# Patient Record
Sex: Male | Born: 1945 | Race: White | Hispanic: No | Marital: Married | Smoking: Former smoker
Health system: Southern US, Community
[De-identification: ages and names within clinical notes are randomized; demographics above are authoritative.]

## PROBLEM LIST (undated history)

## (undated) DIAGNOSIS — E785 Hyperlipidemia, unspecified: Secondary | ICD-10-CM

## (undated) DIAGNOSIS — K219 Gastro-esophageal reflux disease without esophagitis: Secondary | ICD-10-CM

## (undated) DIAGNOSIS — I4891 Unspecified atrial fibrillation: Secondary | ICD-10-CM

## (undated) DIAGNOSIS — Z860100 Personal history of colon polyps, unspecified: Secondary | ICD-10-CM

## (undated) DIAGNOSIS — J45909 Unspecified asthma, uncomplicated: Secondary | ICD-10-CM

## (undated) DIAGNOSIS — R0683 Snoring: Secondary | ICD-10-CM

## (undated) DIAGNOSIS — I4819 Other persistent atrial fibrillation: Secondary | ICD-10-CM

## (undated) DIAGNOSIS — Z8619 Personal history of other infectious and parasitic diseases: Secondary | ICD-10-CM

## (undated) DIAGNOSIS — I10 Essential (primary) hypertension: Secondary | ICD-10-CM

## (undated) HISTORY — DX: Snoring: R06.83

## (undated) HISTORY — DX: Personal history of colon polyps, unspecified: Z86.0100

## (undated) HISTORY — PX: CARDIOVERSION: SHX1299

## (undated) HISTORY — DX: Essential (primary) hypertension: I10

## (undated) HISTORY — DX: Gastro-esophageal reflux disease without esophagitis: K21.9

## (undated) HISTORY — DX: Other persistent atrial fibrillation: I48.19

## (undated) HISTORY — DX: Unspecified atrial fibrillation: I48.91

## (undated) HISTORY — DX: Unspecified asthma, uncomplicated: J45.909

## (undated) HISTORY — DX: Personal history of other infectious and parasitic diseases: Z86.19

## (undated) HISTORY — DX: Personal history of colonic polyps: Z86.010

## (undated) HISTORY — DX: Hyperlipidemia, unspecified: E78.5

---

## 2017-03-14 ENCOUNTER — Ambulatory Visit (INDEPENDENT_AMBULATORY_CARE_PROVIDER_SITE_OTHER): Payer: Medicare HMO | Admitting: Internal Medicine

## 2017-03-14 ENCOUNTER — Encounter: Payer: Self-pay | Admitting: Internal Medicine

## 2017-03-14 VITALS — BP 134/82 | HR 59 | Temp 98.0°F | Wt 251.0 lb

## 2017-03-14 DIAGNOSIS — K219 Gastro-esophageal reflux disease without esophagitis: Secondary | ICD-10-CM

## 2017-03-14 DIAGNOSIS — I480 Paroxysmal atrial fibrillation: Secondary | ICD-10-CM | POA: Diagnosis not present

## 2017-03-14 DIAGNOSIS — E7800 Pure hypercholesterolemia, unspecified: Secondary | ICD-10-CM

## 2017-03-14 NOTE — Progress Notes (Signed)
HPI  Pt presents to the clinic today to establish care and for management of the conditions listed below.   Afib: s/p cardioversion. He is taking Sotalol and ASA as prescribed. He has not established with a cardiologist in the area.  GERD: He is not sure what triggers this. He denies breakthrough on Omeprazole.  HLD: He is not sure when his cholesterol was last checked. He does not take anything to help lower his cholesterol. He could be better about consuming a low fat diet.  Flu: 11/2015 Tetanus: unsure Pneumovax: 2017 Prevnar: 2016 Zostovax: 2017 PSA Screening: unsure Colon Screening: 2016 Vision Screening: as needed Dentist: as needed  Past Medical History:  Diagnosis Date  . Atrial fibrillation ([place])   . Childhood asthma   . GERD (gastroesophageal reflux disease)   . History of chicken pox   . History of colon polyps   . Hyperlipidemia     Current Outpatient Medications  Medication Sig Dispense Refill  . aspirin 81 MG tablet Take 81 mg by mouth daily.    Marland Kitchen omeprazole (PRILOSEC) 20 MG capsule Take 20 mg by mouth daily.    . sotalol (BETAPACE) 120 MG tablet Take 120 mg by mouth 2 (two) times daily.     No current facility-administered medications for this visit.     No Known Allergies  Family History  Problem Relation Age of Onset  . Heart attack Father   . Breast cancer Sister   . Arthritis Maternal Grandmother     Social History   Socioeconomic History  . Marital status: Married    Spouse name: Not on file  . Number of children: Not on file  . Years of education: Not on file  . Highest education level: Not on file  Social Needs  . Financial resource strain: Not on file  . Food insecurity - worry: Not on file  . Food insecurity - inability: Not on file  . Transportation needs - medical: Not on file  . Transportation needs - non-medical: Not on file  Occupational History  . Not on file  Tobacco Use  . Smoking status: Never Smoker  . Smokeless  tobacco: Never Used  Substance and Sexual Activity  . Alcohol use: Yes    Comment: daily liquor  . Drug use: No  . Sexual activity: Not on file  Other Topics Concern  . Not on file  Social History Narrative  . Not on file    ROS:  Constitutional: Denies fever, malaise, fatigue, headache or abrupt weight changes.  Respiratory: Denies difficulty breathing, shortness of breath, cough or sputum production.   Cardiovascular: Denies chest pain, chest tightness, palpitations or swelling in the hands or feet.  Gastrointestinal: Denies abdominal pain, bloating, constipation, diarrhea or blood in the stool.  Neurological: Denies dizziness, difficulty with memory, difficulty with speech or problems with balance and coordination.  Psych: Denies anxiety, depression, SI/HI.  No other specific complaints in a complete review of systems (except as listed in HPI above).  PE:  BP 134/82   Pulse (!) 59   Temp 98 F (36.7 C) (Oral)   Wt 251 lb (113.9 kg)   SpO2 98%  Wt Readings from Last 3 Encounters:  03/14/17 251 lb (113.9 kg)    General: Appears his stated age, obese in NAD. Cardiovascular: Normal rate and rhythm. S1,S2 noted.  No murmur, rubs or gallops noted. No JVD or BLE edema. No carotid bruits noted. Pulmonary/Chest: Normal effort and positive vesicular breath sounds. No respiratory  distress. No wheezes, rales or ronchi noted.  Abdomen: Soft and nontender. Normal bowel sounds, no bruits noted.  Neurological: Alert and oriented.  Psychiatric: Mood and affect normal. Behavior is normal. Judgment and thought content normal.    Assessment and Plan:

## 2017-03-14 NOTE — Assessment & Plan Note (Signed)
Continue Sotalol and ASA He declines referral to cardiology at this time

## 2017-03-14 NOTE — Assessment & Plan Note (Signed)
Encouraged him to consume a low fat diet Will check lipid panel at annual exam

## 2017-03-14 NOTE — Assessment & Plan Note (Signed)
Controlled on Omeprazole He is not interested in weaning medication at this time Discussed how weight loss may help improve his reflux symptoms

## 2017-03-14 NOTE — Patient Instructions (Signed)
Fat and Cholesterol Restricted Diet Getting too much fat and cholesterol in your diet may cause health problems. Following this diet helps keep your fat and cholesterol at normal levels. This can keep you from getting sick. What types of fat should I choose?  Choose monosaturated and polyunsaturated fats. These are found in foods such as olive oil, canola oil, flaxseeds, walnuts, almonds, and seeds.  Eat more omega-3 fats. Good choices include salmon, mackerel, sardines, tuna, flaxseed oil, and ground flaxseeds.  Limit saturated fats. These are in animal products such as meats, butter, and cream. They can also be in plant products such as palm oil, palm kernel oil, and coconut oil.  Avoid foods with partially hydrogenated oils in them. These contain trans fats. Examples of foods that have trans fats are stick margarine, some tub margarines, cookies, crackers, and other baked goods. What general guidelines do I need to follow?  Check food labels. Look for the words "trans fat" and "saturated fat."  When preparing a meal: ? Fill half of your plate with vegetables and green salads. ? Fill one fourth of your plate with whole grains. Look for the word "whole" as the first word in the ingredient list. ? Fill one fourth of your plate with lean protein foods.  Eat more foods that have fiber, like apples, carrots, beans, peas, and barley.  Eat more home-cooked foods. Eat less at restaurants and buffets.  Limit or avoid alcohol.  Limit foods high in starch and sugar.  Limit fried foods.  Cook foods without frying them. Baking, boiling, grilling, and broiling are all great options.  Lose weight if you are overweight. Losing even a small amount of weight can help your overall health. It can also help prevent diseases such as diabetes and heart disease. What foods can I eat? Grains Whole grains, such as whole wheat or whole grain breads, crackers, cereals, and pasta. Unsweetened oatmeal,  bulgur, barley, quinoa, or brown rice. Corn or whole wheat flour tortillas. Vegetables Fresh or frozen vegetables (raw, steamed, roasted, or grilled). Green salads. Fruits All fresh, canned (in natural juice), or frozen fruits. Meat and Other Protein Products Ground beef (85% or leaner), grass-fed beef, or beef trimmed of fat. Skinless chicken or turkey. Ground chicken or turkey. Pork trimmed of fat. All fish and seafood. Eggs. Dried beans, peas, or lentils. Unsalted nuts or seeds. Unsalted canned or dry beans. Dairy Low-fat dairy products, such as skim or 1% milk, 2% or reduced-fat cheeses, low-fat ricotta or cottage cheese, or plain low-fat yogurt. Fats and Oils Tub margarines without trans fats. Light or reduced-fat mayonnaise and salad dressings. Avocado. Olive, canola, sesame, or safflower oils. Natural peanut or almond butter (choose ones without added sugar and oil). The items listed above may not be a complete list of recommended foods or beverages. Contact your dietitian for more options. What foods are not recommended? Grains White bread. White pasta. White rice. Cornbread. Bagels, pastries, and croissants. Crackers that contain trans fat. Vegetables White potatoes. Corn. Creamed or fried vegetables. Vegetables in a cheese sauce. Fruits Dried fruits. Canned fruit in light or heavy syrup. Fruit juice. Meat and Other Protein Products Fatty cuts of meat. Ribs, chicken wings, bacon, sausage, bologna, salami, chitterlings, fatback, hot dogs, bratwurst, and packaged luncheon meats. Liver and organ meats. Dairy Whole or 2% milk, cream, half-and-half, and cream cheese. Whole milk cheeses. Whole-fat or sweetened yogurt. Full-fat cheeses. Nondairy creamers and whipped toppings. Processed cheese, cheese spreads, or cheese curds. Sweets and Desserts Corn   syrup, sugars, honey, and molasses. Candy. Jam and jelly. Syrup. Sweetened cereals. Cookies, pies, cakes, donuts, muffins, and ice  cream. Fats and Oils Butter, stick margarine, lard, shortening, ghee, or bacon fat. Coconut, palm kernel, or palm oils. Beverages Alcohol. Sweetened drinks (such as sodas, lemonade, and fruit drinks or punches). The items listed above may not be a complete list of foods and beverages to avoid. Contact your dietitian for more information. This information is not intended to replace advice given to you by your health care provider. Make sure you discuss any questions you have with your health care provider. Document Released: 06/21/2011 Document Revised: 08/27/2015 Document Reviewed: 03/21/2013 Elsevier Interactive Patient Education  2018 Else[employer] Inc[employer]  

## 2017-07-14 ENCOUNTER — Encounter: Payer: Self-pay | Admitting: Internal Medicine

## 2017-07-14 MED ORDER — SOTALOL HCL 120 MG PO TABS: 120.0000 mg | ORAL_TABLET | Freq: Two times a day (BID) | ORAL | 0 refills | Status: DC

## 2017-08-25 DIAGNOSIS — H5203 Hypermetropia, bilateral: Secondary | ICD-10-CM | POA: Diagnosis not present

## 2017-08-25 DIAGNOSIS — H2513 Age-related nuclear cataract, bilateral: Secondary | ICD-10-CM | POA: Diagnosis not present

## 2017-08-26 DIAGNOSIS — Z0100 Encounter for examination of eyes and vision without abnormal findings: Secondary | ICD-10-CM | POA: Diagnosis not present

## 2017-10-07 ENCOUNTER — Other Ambulatory Visit: Payer: Self-pay | Admitting: Internal Medicine

## 2017-12-04 ENCOUNTER — Ambulatory Visit (INDEPENDENT_AMBULATORY_CARE_PROVIDER_SITE_OTHER): Payer: Medicare HMO | Admitting: Internal Medicine

## 2017-12-04 ENCOUNTER — Encounter: Payer: Self-pay | Admitting: Internal Medicine

## 2017-12-04 VITALS — BP 132/84 | HR 71 | Temp 97.8°F | Ht 69.0 in | Wt 256.0 lb

## 2017-12-04 DIAGNOSIS — E7800 Pure hypercholesterolemia, unspecified: Secondary | ICD-10-CM | POA: Diagnosis not present

## 2017-12-04 DIAGNOSIS — K219 Gastro-esophageal reflux disease without esophagitis: Secondary | ICD-10-CM | POA: Diagnosis not present

## 2017-12-04 DIAGNOSIS — I10 Essential (primary) hypertension: Secondary | ICD-10-CM | POA: Insufficient documentation

## 2017-12-04 DIAGNOSIS — Z0000 Encounter for general adult medical examination without abnormal findings: Secondary | ICD-10-CM | POA: Diagnosis not present

## 2017-12-04 DIAGNOSIS — Z1159 Encounter for screening for other viral diseases: Secondary | ICD-10-CM

## 2017-12-04 DIAGNOSIS — Z125 Encounter for screening for malignant neoplasm of prostate: Secondary | ICD-10-CM

## 2017-12-04 DIAGNOSIS — I480 Paroxysmal atrial fibrillation: Secondary | ICD-10-CM | POA: Diagnosis not present

## 2017-12-04 LAB — CBC
HEMATOCRIT: 47.2 % (ref 39.0–52.0)
Hemoglobin: 16.3 g/dL (ref 13.0–17.0)
MCHC: 34.6 g/dL (ref 30.0–36.0)
MCV: 89.4 fl (ref 78.0–100.0)
PLATELETS: 169 10*3/uL (ref 150.0–400.0)
RBC: 5.28 Mil/uL (ref 4.22–5.81)
RDW: 14.2 % (ref 11.5–15.5)
WBC: 7.1 10*3/uL (ref 4.0–10.5)

## 2017-12-04 LAB — COMPREHENSIVE METABOLIC PANEL
ALBUMIN: 4.1 g/dL (ref 3.5–5.2)
ALT: 14 U/L (ref 0–53)
AST: 16 U/L (ref 0–37)
Alkaline Phosphatase: 65 U/L (ref 39–117)
BUN: 10 mg/dL (ref 6–23)
CALCIUM: 8.7 mg/dL (ref 8.4–10.5)
CHLORIDE: 105 meq/L (ref 96–112)
CO2: 25 mEq/L (ref 19–32)
Creatinine, Ser: 0.93 mg/dL (ref 0.40–1.50)
GFR: 84.83 mL/min (ref >60.00)
Glucose, Bld: 110 mg/dL — ABNORMAL HIGH (ref 70–99)
POTASSIUM: 4.5 meq/L (ref 3.5–5.1)
Sodium: 138 mEq/L (ref 135–145)
Total Bilirubin: 0.7 mg/dL (ref 0.2–1.2)
Total Protein: 6.5 g/dL (ref 6.0–8.3)

## 2017-12-04 LAB — LIPID PANEL
CHOLESTEROL: 184 mg/dL (ref 0–200)
HDL: 39.3 mg/dL (ref >39.00)
LDL Cholesterol: 127 mg/dL — ABNORMAL HIGH (ref 0–99)
NonHDL: 145.03
TRIGLYCERIDES: 88 mg/dL (ref 0.0–149.0)
Total CHOL/HDL Ratio: 5
VLDL: 17.6 mg/dL (ref 0.0–40.0)

## 2017-12-04 LAB — PSA, MEDICARE: PSA: 7.38 ng/ml — ABNORMAL HIGH (ref 0.10–4.00)

## 2017-12-04 NOTE — Progress Notes (Signed)
HPI:  Pt presents to the clinic today for his Medicare Wellness Exam. He is also due to follow up chronic conditions.  Afib: Paroxysmal s/p cardioversion. Managed on Sotalol and ASA. There is no ECG on file. He does not follow with cardiology.  GERD: He is not sure what triggers this. He denies breakthrough on Omeprazole.   HLD: There is no lipid panel on file. He is not currently taking any cholesterol lowering medication. He tries to consume a low fat diet.  Elevated Blood Pressure: His last 2 blood pressures were 132/84, 134/82. He reports his systolic's run 761-607 at home. He denies headaches, dizziness or visual changes. He has never been treated for HTN in the past.   Past Medical History:  Diagnosis Date  . Atrial fibrillation ([place])   . Childhood asthma   . GERD (gastroesophageal reflux disease)   . History of chicken pox   . History of colon polyps   . Hyperlipidemia     Current Outpatient Medications  Medication Sig Dispense Refill  . aspirin 81 MG tablet Take 81 mg by mouth daily.    Marland Kitchen omeprazole (PRILOSEC) 20 MG capsule Take 20 mg by mouth daily.    . sotalol (BETAPACE) 120 MG tablet TAKE 1 TABLET (120 MG TOTAL) BY MOUTH 2 (TWO) TIMES DAILY. 180 tablet 0   No current facility-administered medications for this visit.     No Known Allergies  Family History  Problem Relation Age of Onset  . Heart attack Father   . Breast cancer Sister   . Arthritis Maternal Grandmother     Social History   Socioeconomic History  . Marital status: Married    Spouse name: Not on file  . Number of children: Not on file  . Years of education: Not on file  . Highest education level: Not on file  Occupational History  . Not on file  Social Needs  . Financial resource strain: Not on file  . Food insecurity:    Worry: Not on file    Inability: Not on file  . Transportation needs:    Medical: Not on file    Non-medical: Not on file  Tobacco Use  . Smoking status: Never  Smoker  . Smokeless tobacco: Never Used  Substance and Sexual Activity  . Alcohol use: Yes    Comment: daily liquor  . Drug use: No  . Sexual activity: Not on file  Lifestyle  . Physical activity:    Days per week: Not on file    Minutes per session: Not on file  . Stress: Not on file  Relationships  . Social connections:    Talks on phone: Not on file    Gets together: Not on file    Attends religious service: Not on file    Active member of club or organization: Not on file    Attends meetings of clubs or organizations: Not on file    Relationship status: Not on file  . Intimate partner violence:    Fear of current or ex partner: Not on file    Emotionally abused: Not on file    Physically abused: Not on file    Forced sexual activity: Not on file  Other Topics Concern  . Not on file  Social History Narrative  . Not on file    Hospitiliaztions: None  Health Maintenance:    Flu: 10/2015  Tetanus: unsure  Pneumovax: 2017  Prevnar: 2016  Zostavax: 2017  PSA: unsure  Colon  Screening: 2016  Eye Doctor: 08/2017  Dental Exam: as needed, no dentures   Providers:   PCP: Webb Silversmith, NP-C    I have personally reviewed and have noted:  1. The patient's medical and social history 2. Their use of alcohol, tobacco or illicit drugs 3. Their current medications and supplements 4. The patient's functional ability including ADL's, fall risks, home safety risks and hearing or visual impairment. 5. Diet and physical activities 6. Evidence for depression or mood disorder  Subjective:   Review of Systems:   Constitutional: Denies fever, malaise, fatigue, headache or abrupt weight changes.  HEENT: Denies eye pain, eye redness, ear pain, ringing in the ears, wax buildup, runny nose, nasal congestion, bloody nose, or sore throat. Respiratory: Denies difficulty breathing, shortness of breath, cough or sputum production.   Cardiovascular: Denies chest pain, chest tightness,  palpitations or swelling in the hands or feet.  Gastrointestinal: Denies abdominal pain, bloating, constipation, diarrhea or blood in the stool.  GU: Denies urgency, frequency, pain with urination, burning sensation, blood in urine, odor or discharge. Musculoskeletal: Denies decrease in range of motion, difficulty with gait, muscle pain or joint pain and swelling.  Skin: Denies redness, rashes, lesions or ulcercations.  Neurological: Denies dizziness, difficulty with memory, difficulty with speech or problems with balance and coordination.  Psych: Denies anxiety, depression, SI/HI.  No other specific complaints in a complete review of systems (except as listed in HPI above).  Objective:  PE:   BP 132/84   Pulse 71   Temp 97.8 F (36.6 C) (Oral)   Ht 5\' 9"  (1.753 m)   Wt 256 lb (116.1 kg)   SpO2 98%   BMI 37.80 kg/m   Wt Readings from Last 3 Encounters:  03/14/17 251 lb (113.9 kg)    General: Appears his stated age, obese, in NAD. Skin: Warm, dry and intact.  HEENT: Head: normal shape and size; Eyes: sclera white, no icterus, conjunctiva pink, PERRLA and EOMs intact; Ears: Tm's gray and intact, normal light reflex; Throat/Mouth: Teeth present, mucosa pink and moist, no exudate, lesions or ulcerations noted.  Neck: Neck supple, trachea midline. No masses, lumps or thyromegaly present.  Cardiovascular: Normal rate and rhythm. S1,S2 noted.  No murmur, rubs or gallops noted. No JVD or BLE edema. No carotid bruits noted. Pulmonary/Chest: Normal effort and positive vesicular breath sounds. No respiratory distress. No wheezes, rales or ronchi noted.  Abdomen: Soft and nontender. Normal bowel sounds. Ventral hernia noted. Liver, spleen and kidneys non palpable. Musculoskeletal:  Strength 5/5 BUE/BLE. No signs of joint swelling.  Neurological: Alert and oriented. Cranial nerves II-XII grossly intact. Coordination normal.  Psychiatric: Mood and affect normal. Behavior is normal. Judgment  and thought content normal.   Assessment and Plan:   Medicare Annual Wellness Visit:  Diet: He does eat meat. He consumes fruits and veggies daily. He does eat some fried foods. She drinks mostly water, 2 cocktails nightly. Physical activity: Silver Sneakers 3 days per week, swimming and walking Depression/mood screen: Negative Hearing: Intact to whispered voice Visual acuity: Grossly normal, performs annual eye exam  ADLs: Capable Fall risk: None Home safety: Good Cognitive evaluation: Intact to orientation, naming, recall and repetition EOL planning: No adv directives, full code/ I agree  Preventative Medicine: He declines flu and tetanus booster. Pneumovax, Prevnar and Zostovax UTD. Colon screening UTD, will request copy. Encouraged him to consume a balanced diet and exercise regimen. Advised him to see an eye doctor and dentist annually. Will check CBC,  CMET, Lipid, PSA and Hep C today.   Next appointment: 1 year, Medicare Wellness Exam   Webb Silversmith, NP

## 2017-12-04 NOTE — Assessment & Plan Note (Signed)
CMET and Lipid profile today Encouraged him to consume a low fat dieet Will monitor

## 2017-12-04 NOTE — Patient Instructions (Signed)

## 2017-12-04 NOTE — Assessment & Plan Note (Signed)
Continue Omeprazole for now Discussed how weight loss and avoiding triggers can reduce reflux CBC and CMET today

## 2017-12-04 NOTE — Assessment & Plan Note (Signed)
Discussed diagnostic criteria for HTN He reports because his is not having any issues, and he feels well, he does not want to start antihypertensive therapy at this time Discussed increased risk for heart attack and stroke Discussed DASH diet and exercise for weight loss

## 2017-12-04 NOTE — Assessment & Plan Note (Signed)
Stable on Sotalol and ASA He is not following with cardiology CBC today

## 2017-12-05 LAB — HEPATITIS C ANTIBODY
HEP C AB: NONREACTIVE
SIGNAL TO CUT-OFF: 0.04 (ref <1.00)

## 2017-12-08 ENCOUNTER — Telehealth: Payer: Self-pay

## 2017-12-08 NOTE — Telephone Encounter (Signed)
Left detailed msg on VM per HIPAA  

## 2017-12-08 NOTE — Telephone Encounter (Signed)
-----   Message from Jearld Fenton, NP sent at 12/05/2017 10:28 AM EST ----- Call pt:  You do not have Hep C. Liver and kidney function normal. Glucose elevated, was he fasting? If not, this would be WNL. Cholesterol looks good. PSA elevated. Need to get notes from previous urologist (can we get this, he filled out med rec release). Would he be agreeable to urology referral at this time?

## 2017-12-08 NOTE — Telephone Encounter (Signed)
Pt returned call to melanie.

## 2018-01-10 ENCOUNTER — Other Ambulatory Visit: Payer: Self-pay | Admitting: Internal Medicine

## 2018-02-26 DIAGNOSIS — H40013 Open angle with borderline findings, low risk, bilateral: Secondary | ICD-10-CM | POA: Diagnosis not present

## 2018-02-26 DIAGNOSIS — H2513 Age-related nuclear cataract, bilateral: Secondary | ICD-10-CM | POA: Diagnosis not present

## 2018-03-02 DIAGNOSIS — H40013 Open angle with borderline findings, low risk, bilateral: Secondary | ICD-10-CM | POA: Diagnosis not present

## 2018-03-02 DIAGNOSIS — H2513 Age-related nuclear cataract, bilateral: Secondary | ICD-10-CM | POA: Diagnosis not present

## 2018-03-02 DIAGNOSIS — H2512 Age-related nuclear cataract, left eye: Secondary | ICD-10-CM | POA: Diagnosis not present

## 2018-03-08 DIAGNOSIS — H2512 Age-related nuclear cataract, left eye: Secondary | ICD-10-CM | POA: Diagnosis not present

## 2018-07-30 DIAGNOSIS — H2511 Age-related nuclear cataract, right eye: Secondary | ICD-10-CM | POA: Diagnosis not present

## 2018-07-30 DIAGNOSIS — Z961 Presence of intraocular lens: Secondary | ICD-10-CM | POA: Diagnosis not present

## 2018-08-16 DIAGNOSIS — H2511 Age-related nuclear cataract, right eye: Secondary | ICD-10-CM | POA: Diagnosis not present

## 2018-09-18 ENCOUNTER — Ambulatory Visit (INDEPENDENT_AMBULATORY_CARE_PROVIDER_SITE_OTHER): Payer: Medicare HMO

## 2018-09-18 DIAGNOSIS — Z23 Encounter for immunization: Secondary | ICD-10-CM | POA: Diagnosis not present

## 2018-10-03 ENCOUNTER — Telehealth: Payer: Self-pay | Admitting: Internal Medicine

## 2018-10-03 NOTE — Telephone Encounter (Signed)
He also wants a refill on sotalol sent to [facility]. He said he has about a week and half left. He has not contacted the pharmacy regarding prescription. Please advise on pneumonia shot. Thanks.  CB [phone]

## 2018-10-03 NOTE — Telephone Encounter (Signed)
Pt is wanting to get pneumonia shot scheduled. He said his myChart said he is overdue.

## 2018-10-03 NOTE — Telephone Encounter (Signed)
Pt also wanted to discuss getting referral for achilles heel issue. I scheduled the patient an appointment for tomorrow 10/04/18 @ 10:15am. I let patient know that you will probably go over whether he needs pneumonia shot and med refills.

## 2018-10-04 ENCOUNTER — Other Ambulatory Visit: Payer: Self-pay

## 2018-10-04 ENCOUNTER — Ambulatory Visit (INDEPENDENT_AMBULATORY_CARE_PROVIDER_SITE_OTHER): Payer: Medicare HMO | Admitting: Internal Medicine

## 2018-10-04 VITALS — BP 126/84 | HR 58 | Temp 98.6°F | Wt 256.0 lb

## 2018-10-04 DIAGNOSIS — M7661 Achilles tendinitis, right leg: Secondary | ICD-10-CM | POA: Diagnosis not present

## 2018-10-04 MED ORDER — SOTALOL HCL 120 MG PO TABS: 120.0000 mg | ORAL_TABLET | Freq: Two times a day (BID) | ORAL | 0 refills | Status: DC

## 2018-10-04 MED ORDER — MELOXICAM 7.5 MG PO TABS: 7.5000 mg | ORAL_TABLET | Freq: Every day | ORAL | 0 refills | Status: DC

## 2018-10-04 NOTE — Patient Instructions (Signed)
Achilles Tendinitis  Achilles tendinitis is inflammation of the tough, cord-like band that attaches the lower leg muscles to the heel bone (Achilles tendon). This is usually caused by overusing the tendon and the ankle joint. Achilles tendinitis usually gets better over time with treatment and caring for yourself at home. It can take weeks or months to heal completely. What are the causes? This condition may be caused by:  A sudden increase in exercise or activity, such as running.  Doing the same exercises or activities (such as jumping) over and over.  Not warming up calf muscles before exercising.  Exercising in shoes that are worn out or not made for exercise.  Having arthritis or a bone growth (spur) on the back of the heel bone. This can rub against the tendon and hurt it.  Age-related wear and tear. Tendons become less flexible with age and more likely to be injured. What are the signs or symptoms? Common symptoms of this condition include:  Pain in the Achilles tendon or in the back of the leg, just above the heel. The pain usually gets worse with exercise.  Stiffness or soreness in the back of the leg, especially in the morning.  Swelling of the skin over the Achilles tendon.  Thickening of the tendon.  Bone spurs at the bottom of the Achilles tendon, near the heel.  Trouble standing on tiptoe. How is this diagnosed? This condition is diagnosed based on your symptoms and a physical exam. You may have tests, including:  X-rays.  MRI. How is this treated? The goal of treatment is to relieve symptoms and help your injury heal. Treatment may include:  Decreasing or stopping activities that caused the tendinitis. This may mean switching to low-impact exercises like biking or swimming.  Icing the injured area.  Doing physical therapy, including strengthening and stretching exercises.  NSAIDs to help relieve pain and swelling.  Using supportive shoes, wraps, heel  lifts, or a walking boot (air cast).  Surgery. This may be done if your symptoms do not improve after 6 months.  Using high-energy shock wave impulses to stimulate the healing process (extracorporeal shock wave therapy). This is rare.  Injection of medicines to help relieve inflammation (corticosteroids). This is rare. Follow these instructions at home: If you have an air cast:  Wear the cast as told by your health care provider. Remove it only as told by your health care provider.  Loosen the cast if your toes tingle, become numb, or turn cold and blue. Activity  Gradually return to your normal activities once your health care provider approves. Do not do activities that cause pain. ? Consider doing low-impact exercises, like cycling or swimming.  If you have an air cast, ask your health care provider when it is safe for you to drive.  If physical therapy was prescribed, do exercises as told by your health care provider or physical therapist. Managing pain, stiffness, and swelling   Raise (elevate) your foot above the level of your heart while you are sitting or lying down.  Move your toes often to avoid stiffness and to lessen swelling.  If directed, put ice on the injured area: ? Put ice in a plastic bag. ? Place a towel between your skin and the bag. ? Leave the ice on for 20 minutes, 2-3 times a day General instructions  If directed, wrap your foot with an elastic bandage or other wrap. This can help keep your tendon from moving too much while it   heals. Your health care provider will show you how to wrap your foot correctly.  Wear supportive shoes or heel lifts only as told by your health care provider.  Take over-the-counter and prescription medicines only as told by your health care provider.  Keep all follow-up visits as told by your health care provider. This is important. Contact a health care provider if:  You have symptoms that gets worse.  You have pain that  does not get better with medicine.  You develop new, unexplained symptoms.  You develop warmth and swelling in your foot.  You have a fever. Get help right away if:  You have a sudden popping sound or sensation in your Achilles tendon followed by severe pain.  You cannot move your toes or foot.  You cannot put any weight on your foot. Summary  Achilles tendinitis is inflammation of the tough, cord-like band that attaches the lower leg muscles to the heel bone (Achilles tendon).  This condition is usually caused by overusing the tendon and the ankle joint. It can also be caused by arthritis or normal aging.  The most common symptoms of this condition include pain, swelling, or stiffness in the Achilles tendon or in the back of the leg.  This condition is usually treated with rest, NSAIDs, and physical therapy. This information is not intended to replace advice given to you by your health care provider. Make sure you discuss any questions you have with your health care provider. Document Released: 09/29/2004 Document Revised: 12/02/2016 Document Reviewed: 11/09/2015 Elsevier Patient Education  2020 [employer]  

## 2018-10-06 ENCOUNTER — Encounter: Payer: Self-pay | Admitting: Internal Medicine

## 2018-10-06 NOTE — Progress Notes (Signed)
Subjective:    Patient ID: Kent James, male    DOB: 1945-04-18, 73 y.o.   MRN: [id number]  HPI  Past to the clinic today complaining of right posterior heel pain.  He reports this started 2 years ago.  It is intermittent.  He denies pain as sore and achy but can be intermittently sharp.  Pain does not radiate area.  The pain worse after walking standing for long periods of time.  He has not noticed any redness or swelling has noted at the area is tender to touch.  He has been managing this with special or shoes and arch supports which have proctitis relief until this point.  He has not tried any medications on for this.  He would like a referral to podiatry.  Review of Systems      Past Medical History:  Diagnosis Date  . Atrial fibrillation ([place])   . Childhood asthma   . GERD (gastroesophageal reflux disease)   . History of chicken pox   . History of colon polyps   . Hyperlipidemia     Current Outpatient Medications  Medication Sig Dispense Refill  . aspirin 81 MG tablet Take 81 mg by mouth daily.    Marland Kitchen omeprazole (PRILOSEC) 20 MG capsule Take 20 mg by mouth daily.    . sotalol (BETAPACE) 120 MG tablet Take 1 tablet (120 mg total) by mouth 2 (two) times daily. 180 tablet 0  . meloxicam (MOBIC) 7.5 MG tablet Take 1 tablet (7.5 mg total) by mouth daily. 30 tablet 0   No current facility-administered medications for this visit.     No Known Allergies  Family History  Problem Relation Age of Onset  . Heart attack Father   . Breast cancer Sister   . Arthritis Maternal Grandmother     Social History   Socioeconomic History  . Marital status: Married    Spouse name: Not on file  . Number of children: Not on file  . Years of education: Not on file  . Highest education level: Not on file  Occupational History  . Not on file  Social Needs  . Financial resource strain: Not on file  . Food insecurity    Worry: Not on file    Inability: Not on file  . Transportation  needs    Medical: Not on file    Non-medical: Not on file  Tobacco Use  . Smoking status: Never Smoker  . Smokeless tobacco: Never Used  Substance and Sexual Activity  . Alcohol use: Yes    Comment: daily liquor  . Drug use: No  . Sexual activity: Not on file  Lifestyle  . Physical activity    Days per week: Not on file    Minutes per session: Not on file  . Stress: Not on file  Relationships  . Social Herbalist on phone: Not on file    Gets together: Not on file    Attends religious service: Not on file    Active member of club or organization: Not on file    Attends meetings of clubs or organizations: Not on file    Relationship status: Not on file  . Intimate partner violence    Fear of current or ex partner: Not on file    Emotionally abused: Not on file    Physically abused: Not on file    Forced sexual activity: Not on file  Other Topics Concern  . Not on file  Social History Narrative  . Not on file     Constitutional: Denies fever, malaise, fatigue, headache or abrupt weight changes.  Musculoskeletal: Patient reports right heel pain.  Denies decrease in range of motion, difficulty with gait, muscle pain or joint pain and swelling.  Skin: Denies redness, rashes, lesions or ulcercations.  Neurological: Denies numbness, tingling, weakness or burning sensation of the right lower extremity.    No other specific complaints in a complete review of systems (except as listed in HPI above).  Objective:   Physical Exam  BP 126/84   Pulse (!) 58   Temp 98.6 F (37 C) (Temporal)   Wt 256 lb (116.1 kg)   SpO2 98%   BMI 37.80 kg/m  Wt Readings from Last 3 Encounters:  10/04/18 256 lb (116.1 kg)  12/04/17 256 lb (116.1 kg)  03/14/17 251 lb (113.9 kg)    General: Appears his stated age, well developed, well nourished in NAD. Skin: Warm, dry and intact. No rashes, redness or swelling noted. Cardiovascular: Bradycardic.  Pedal pulses 2+ bilaterally.  Musculoskeletal: Normal flexion, extension rotation of the right ankle.  No joint swelling noted.  He points to the Achilles tendon as side of his pain.  Strength/5 BLE.  No difficulty with gait.  Neurological: Alert and oriented.  Sensation intact BLE      Component Value Date/Time   NA 138 12/04/2017 1014   K 4.5 12/04/2017 1014   CL 105 12/04/2017 1014   CO2 25 12/04/2017 1014   GLUCOSE 110 (H) 12/04/2017 1014   BUN 10 12/04/2017 1014   CREATININE 0.93 12/04/2017 1014   CALCIUM 8.7 12/04/2017 1014    Lipid Panel     Component Value Date/Time   CHOL 184 12/04/2017 1014   TRIG 88.0 12/04/2017 1014   HDL 39.30 12/04/2017 1014   CHOLHDL 5 12/04/2017 1014   VLDL 17.6 12/04/2017 1014   LDLCALC 127 (H) 12/04/2017 1014    CBC    Component Value Date/Time   WBC 7.1 12/04/2017 1014   RBC 5.28 12/04/2017 1014   HGB 16.3 12/04/2017 1014   HCT 47.2 12/04/2017 1014   PLT 169.0 12/04/2017 1014   MCV 89.4 12/04/2017 1014   MCHC 34.6 12/04/2017 1014   RDW 14.2 12/04/2017 1014    Hgb A1C No results found for: HGBA1C          Assessment & Plan:   Achilles Tendonitis:  Encouraged regular stretching Will start Meloxicam 7.5 mg daily, kidney function reviewed and normal Referral to podiatrist placed  Return precautions discussed Webb Silversmith, NP

## 2018-10-19 ENCOUNTER — Other Ambulatory Visit: Payer: Self-pay

## 2018-10-19 ENCOUNTER — Encounter: Payer: Self-pay | Admitting: Podiatry

## 2018-10-19 ENCOUNTER — Ambulatory Visit: Payer: Medicare HMO | Admitting: Podiatry

## 2018-10-19 ENCOUNTER — Ambulatory Visit (INDEPENDENT_AMBULATORY_CARE_PROVIDER_SITE_OTHER): Payer: Medicare HMO

## 2018-10-19 VITALS — BP 161/89

## 2018-10-19 DIAGNOSIS — M79672 Pain in left foot: Secondary | ICD-10-CM

## 2018-10-19 DIAGNOSIS — Q6671 Congenital pes cavus, right foot: Secondary | ICD-10-CM | POA: Diagnosis not present

## 2018-10-19 DIAGNOSIS — M7661 Achilles tendinitis, right leg: Secondary | ICD-10-CM

## 2018-10-21 ENCOUNTER — Encounter: Payer: Self-pay | Admitting: Podiatry

## 2018-10-21 NOTE — Progress Notes (Signed)
Subjective:  Patient ID: Kent James, male    DOB: 1945/09/29,  MRN: [id number]  Chief Complaint  Patient presents with  . Foot Pain    pt is here for achilles tendinitis of the right foot, pt states that the pain has been going on for about a year and a half    73 y.o. male presents with the above complaint.  Patient states that he always has his chronic Achilles tendinitis.  However today his tendinitis has gone down.  He states that because his primary care gave him Mobic.  This has extremely helped bring down the inflammation of the tendon.  It has been going on for about a year and a half.  With it being really bad about couple of months ago.  He states that it is stabbing pain in nature.  He has not done beside taking Mobic totally help alleviate the pain.  He also states that his shoes always get worn out on the outside of his feet.   Review of Systems: Negative except as noted in the HPI. Denies N/V/F/Ch.  Past Medical History:  Diagnosis Date  . Atrial fibrillation ([place])   . Childhood asthma   . GERD (gastroesophageal reflux disease)   . History of chicken pox   . History of colon polyps   . Hyperlipidemia     Current Outpatient Medications:  .  aspirin 81 MG tablet, Take 81 mg by mouth daily., Disp: , Rfl:  .  meloxicam (MOBIC) 7.5 MG tablet, Take 1 tablet (7.5 mg total) by mouth daily., Disp: 30 tablet, Rfl: 0 .  omeprazole (PRILOSEC) 20 MG capsule, Take 20 mg by mouth daily., Disp: , Rfl:  .  sotalol (BETAPACE) 120 MG tablet, Take 1 tablet (120 mg total) by mouth 2 (two) times daily., Disp: 180 tablet, Rfl: 0  Social History   Tobacco Use  Smoking Status Never Smoker  Smokeless Tobacco Never Used    No Known Allergies Objective:   Vitals:   10/19/18 1016  BP: (!) 161/89   There is no height or weight on file to calculate BMI. Constitutional Well developed. Well nourished.  Vascular Dorsalis pedis pulses palpable bilaterally. Posterior tibial pulses  palpable bilaterally. Capillary refill normal to all digits.  No cyanosis or clubbing noted. Pedal hair growth normal.  Neurologic Normal speech. Oriented to person, place, and time. Epicritic sensation to light touch grossly present bilaterally.  Dermatologic Nails well groomed and normal in appearance. No open wounds. No skin lesions.  Orthopedic:  Mild pain on palpation to the right Achilles insertion.  Positive Silfverskiold test for gastroc equinus.  No pain on the anterior aspect of the ankle joint no pain at the ATFL no pain at the medial portion of the ankle joint.  Gait examination showed calcaneal varus deformity in resting position.   Radiographs: 2 views of skeletally mature right foot.  There is no Haglund's deformity thus noted.  There is slight increase in calcaneal inclination angle.  No other bony deformities noted.  No ossicles or enthesophyte identified. Assessment:   1. Achilles tendinitis, right leg   2. Cavus deformity of right foot    Plan:  Patient was evaluated and treated and all questions answered.  Right Achilles tendinitis -Patient has tried all conservative therapy.  He states that Mobic has considerably helped him.  I instructed and educated him on stretching exercises and importance of doing them. -Given his slightly cavus foot type, I recommend the patient would benefit from custom-made  orthotics.  He states that he has not tried them but would like to purchase them. -I have Liliane Channel come over to scan for orthotics.   Return in about 4 weeks (around 11/16/2018).

## 2018-10-27 ENCOUNTER — Other Ambulatory Visit: Payer: Self-pay | Admitting: Internal Medicine

## 2018-10-31 ENCOUNTER — Other Ambulatory Visit: Payer: Self-pay

## 2018-10-31 ENCOUNTER — Emergency Department (HOSPITAL_COMMUNITY)
Admission: EM | Admit: 2018-10-31 | Discharge: 2018-10-31 | Discharge status: Against Medical Advice | Payer: Medicare HMO | Attending: Emergency Medicine | Admitting: Emergency Medicine

## 2018-10-31 ENCOUNTER — Encounter (HOSPITAL_COMMUNITY): Payer: Self-pay | Admitting: Emergency Medicine

## 2018-10-31 ENCOUNTER — Emergency Department (HOSPITAL_COMMUNITY): Payer: Medicare HMO

## 2018-10-31 ENCOUNTER — Telehealth: Payer: Self-pay

## 2018-10-31 DIAGNOSIS — Z5321 Procedure and treatment not carried out due to patient leaving prior to being seen by health care provider: Secondary | ICD-10-CM | POA: Insufficient documentation

## 2018-10-31 DIAGNOSIS — I4891 Unspecified atrial fibrillation: Secondary | ICD-10-CM | POA: Diagnosis not present

## 2018-10-31 DIAGNOSIS — I10 Essential (primary) hypertension: Secondary | ICD-10-CM | POA: Diagnosis not present

## 2018-10-31 LAB — CBC
HCT: 49.9 % (ref 39.0–52.0)
Hemoglobin: 16.9 g/dL (ref 13.0–17.0)
MCH: 30 pg (ref 26.0–34.0)
MCHC: 33.9 g/dL (ref 30.0–36.0)
MCV: 88.6 fL (ref 80.0–100.0)
Platelets: 194 10*3/uL (ref 150–400)
RBC: 5.63 MIL/uL (ref 4.22–5.81)
RDW: 13.2 % (ref 11.5–15.5)
WBC: 7.1 10*3/uL (ref 4.0–10.5)
nRBC: 0 % (ref 0.0–0.2)

## 2018-10-31 LAB — BASIC METABOLIC PANEL
Anion gap: 7 (ref 5–15)
BUN: 14 mg/dL (ref 8–23)
CO2: 26 mmol/L (ref 22–32)
Calcium: 8.9 mg/dL (ref 8.9–10.3)
Chloride: 105 mmol/L (ref 98–111)
Creatinine, Ser: 1.07 mg/dL (ref 0.61–1.24)
GFR calc Af Amer: >60 mL/min (ref >60)
GFR calc non Af Amer: >60 mL/min (ref >60)
Glucose, Bld: 109 mg/dL — ABNORMAL HIGH (ref 70–99)
Potassium: 4.1 mmol/L (ref 3.5–5.1)
Sodium: 138 mmol/L (ref 135–145)

## 2018-10-31 MED ORDER — SODIUM CHLORIDE 0.9% FLUSH: 3.0000 mL | Freq: Once | INTRAVENOUS | Status: DC

## 2018-10-31 NOTE — ED Triage Notes (Signed)
Pt reports being in a.fib for 1 week, he thought it would go away but it hasn't he also had some HTN at home with readings of 170/100. Hx cardioversion's and takes Sotalol. Reports mild SOB. A/O x4 NAD.

## 2018-10-31 NOTE — Telephone Encounter (Signed)
Pt calling for referral to cardiology, Dr Martinique. Pt has not seen card since moved from [place]. While in [place] pt had 2 cardioversions done.for over one wk pt has been in afib with very irregular and pounding heart beat.pt is not sure how fast heart beat is because he has not taken his BP or P. Pt gets SOB upon exertion. No Cp or H/A but does have dizziness on and off.Pts wife took pts BP now 172/108 P 78. Pt is going to [facility] ED by car;will take approx 45 ' to get there and wants me to call [facility] to let them know pt is on the way. I spoke with [facility] in [place] triage and she voiced understanding. FYI to Avie Echevaria NP.

## 2018-10-31 NOTE — ED Notes (Signed)
Pt stated that he felt better and that he was going home. This NT encouraged the pt to stay and be seen by one of our ED Provider. Pt stated that the wait time was too long and that he would follow up with his primary care doctor.

## 2018-10-31 NOTE — Telephone Encounter (Signed)
Noted, he needs to schedule hospital follow up

## 2018-11-01 ENCOUNTER — Encounter: Payer: Self-pay | Admitting: Internal Medicine

## 2018-11-01 NOTE — Telephone Encounter (Signed)
msg sent via my chart

## 2018-11-02 ENCOUNTER — Encounter: Payer: Self-pay | Admitting: Cardiology

## 2018-11-02 ENCOUNTER — Other Ambulatory Visit: Payer: Self-pay

## 2018-11-02 ENCOUNTER — Ambulatory Visit: Payer: Medicare HMO | Admitting: Cardiology

## 2018-11-02 VITALS — BP 165/99 | HR 64 | Temp 97.1°F | Ht 70.0 in | Wt 255.0 lb

## 2018-11-02 DIAGNOSIS — I480 Paroxysmal atrial fibrillation: Secondary | ICD-10-CM | POA: Diagnosis not present

## 2018-11-02 DIAGNOSIS — I10 Essential (primary) hypertension: Secondary | ICD-10-CM

## 2018-11-02 DIAGNOSIS — R0683 Snoring: Secondary | ICD-10-CM | POA: Diagnosis not present

## 2018-11-02 MED ORDER — APIXABAN 5 MG PO TABS: 5.0000 mg | ORAL_TABLET | Freq: Two times a day (BID) | ORAL | 3 refills | Status: DC

## 2018-11-02 MED ORDER — LISINOPRIL 10 MG PO TABS: 10.0000 mg | ORAL_TABLET | Freq: Every day | ORAL | 3 refills | Status: DC

## 2018-11-02 NOTE — Progress Notes (Signed)
Cardiology Office Note:    Date:  11/02/2018   ID:  Kent James, DOB 22-Nov-1945, MRN [id number]  PCP:  Jearld Fenton, NP  Cardiologist:  No primary care provider on file.  Electrophysiologist:  None   Referring MD: Jearld Fenton, NP   Chief Complaint  Patient presents with  . Atrial Fibrillation    History of Present Illness:    Kent James is a 73 y.o. male with a hx of atrial fibrillation who is referred by Webb Silversmith, NP for an evaluation of atrial fibrillation.  He was diagnosed with atrial fibrillation in 2018.  He underwent 2 cardioversions in 2018.  First cardioversion was successful but after 2 weeks weeks went back into atrial fibrillation.  He underwent a second cardioversion at that time.  He was also started on sotalol.  He states that over the last 2 years he has had 3-4 episodes where he has been in A. fib, typically lasting about an hour.  However he had one episode that lasted 24 hours.  He states that he always feels it when he is out of rhythm, as he notes that he is short of breath and checks his pulse and notes that it is irregular.  On 10/23/2018, he noted that he was feeling short of breath.  He checked his pulse and it was fast and irregular.  He reports his BP was very elevated up to the 170s.  States that when he checks at home has typically been in the 130s to 140s.  He continued to have symptoms, prompting him to go to the ED on 10/28.  While in the waiting room at the ED, he felt that he converted back to sinus rhythm.  EKG at the ED confirmed he was in sinus, so he left without being seen.  Currently denies any symptoms.  Does report some dyspnea on exertion, but denies any exertional chest pain.  Does have a smoking history but quit smoking 20 years ago.  Smoked 1 pack/day for 25 years.  No issues with bleeding.  Does report that he has been told he snores, and his wife has noted a few episodes where he seems to stop breathing at night.     Past  Medical History:  Diagnosis Date  . Atrial fibrillation ([place])   . Childhood asthma   . GERD (gastroesophageal reflux disease)   . History of chicken pox   . History of colon polyps   . Hyperlipidemia     No past surgical history on file.  Current Medications: Current Meds  Medication Sig  . meloxicam (MOBIC) 7.5 MG tablet TAKE 1 TABLET BY MOUTH EVERY DAY  . omeprazole (PRILOSEC) 20 MG capsule Take 20 mg by mouth daily.  . sotalol (BETAPACE) 120 MG tablet Take 1 tablet (120 mg total) by mouth 2 (two) times daily.  . [DISCONTINUED] aspirin 81 MG tablet Take 81 mg by mouth daily.     Allergies:   Patient has no known allergies.   Social History   Socioeconomic History  . Marital status: Married    Spouse name: Not on file  . Number of children: Not on file  . Years of education: Not on file  . Highest education level: Not on file  Occupational History  . Not on file  Social Needs  . Financial resource strain: Not on file  . Food insecurity    Worry: Not on file    Inability: Not on file  . Transportation needs  Medical: Not on file    Non-medical: Not on file  Tobacco Use  . Smoking status: Never Smoker  . Smokeless tobacco: Never Used  Substance and Sexual Activity  . Alcohol use: Yes    Comment: daily liquor  . Drug use: No  . Sexual activity: Not on file  Lifestyle  . Physical activity    Days per week: Not on file    Minutes per session: Not on file  . Stress: Not on file  Relationships  . Social Herbalist on phone: Not on file    Gets together: Not on file    Attends religious service: Not on file    Active member of club or organization: Not on file    Attends meetings of clubs or organizations: Not on file    Relationship status: Not on file  Other Topics Concern  . Not on file  Social History Narrative  . Not on file     Family History: The patient's family history includes Arthritis in his maternal grandmother; Breast cancer in  his sister; Heart attack in his father.  ROS:   Please see the history of present illness.     All other systems reviewed and are negative.  EKGs/Labs/Other Studies Reviewed:    The following studies were reviewed today:   EKG:  EKG is  ordered today.  The ekg ordered today demonstrates normal sinus rhythm, rate 60, QTc 438, no ST/T changes  Recent Labs: 12/04/2017: ALT 14 10/31/2018: BUN 14; Creatinine, Ser 1.07; Hemoglobin 16.9; Platelets 194; Potassium 4.1; Sodium 138  Recent Lipid Panel    Component Value Date/Time   CHOL 184 12/04/2017 1014   TRIG 88.0 12/04/2017 1014   HDL 39.30 12/04/2017 1014   CHOLHDL 5 12/04/2017 1014   VLDL 17.6 12/04/2017 1014   LDLCALC 127 (H) 12/04/2017 1014    Physical Exam:    VS:  BP (!) 165/99   Pulse 64   Temp (!) 97.1 F (36.2 C)   Ht 5\' 10"  (1.778 m)   Wt 255 lb (115.7 kg)   SpO2 97%   BMI 36.59 kg/m     Wt Readings from Last 3 Encounters:  11/02/18 255 lb (115.7 kg)  10/31/18 255 lb 11.7 oz (116 kg)  10/04/18 256 lb (116.1 kg)     GEN:  Well nourished, well developed in no acute distress HEENT: Normal NECK: No JVD; No carotid bruits LYMPHATICS: No lymphadenopathy CARDIAC: RRR, no murmurs, rubs, gallops RESPIRATORY:  Clear to auscultation without rales, wheezing or rhonchi  ABDOMEN: Soft, non-tender, non-distended MUSCULOSKELETAL:  1+ LE edema SKIN: Warm and dry NEUROLOGIC:  Alert and oriented x 3 PSYCHIATRIC:  Normal affect   ASSESSMENT:    1. Paroxysmal atrial fibrillation (HCC)   2. Essential hypertension   3. Snores    PLAN:    Paroxysmal atrial fibrillation: Currently in sinus rhythm, but had recent recurrence of AF that resolved spontaneously.  Unclear what triggered episode.  Suspect he may have undiagnosed OSA.  Currently on sotalol 120 mg twice daily for rhythm control.  He is not on anticoagulation, as CHADSVASc has been 1 due to age.  However his blood pressures have been elevated, suggesting likely  undiagnosed hypertension.  Given hypertension, his CHADSVASc score is 2, warranting anticoagulation -Continue sotalol 120 mg BID.  QTc 438 on EKG today -Start Eliquis 5 mg twice daily -Check TSH -Sleep study -TTE  Hypertension: Reports BP has been in 130s to 140s at  home.  165/99 in clinic today. -Start lisinopril 10 mg daily.  Will check BMET in 1 week -Asked him to check blood pressure daily for next 2 weeks and call with results  Suspected OSA: will order sleep study  RTC in 1 month  Medication Adjustments/Labs and Tests Ordered: Current medicines are reviewed at length with the patient today.  Concerns regarding medicines are outlined above.  Orders Placed This Encounter  Procedures  . Basic metabolic panel  . TSH  . EKG 12-Lead  . ECHOCARDIOGRAM COMPLETE  . Split night study   Meds ordered this encounter  Medications  . apixaban (ELIQUIS) 5 MG TABS tablet    Sig: Take 1 tablet (5 mg total) by mouth 2 (two) times daily.    Dispense:  180 tablet    Refill:  3  . lisinopril (ZESTRIL) 10 MG tablet    Sig: Take 1 tablet (10 mg total) by mouth daily.    Dispense:  90 tablet    Refill:  3    Patient Instructions  Medication Instructions:  Stop Aspirin Start Eliquis 5 mg twice a day  Start Lisinopril 10 mg daily  *If you need a refill on your cardiac medications before your next appointment, please call your pharmacy*  Lab Work: Bmet and TSH in 1 week  Lab order enclosed   Testing/Procedures: Schedule Echo Schedule Sleep Study  Follow-Up: At [facility], you and your health needs are our priority.  As part of our continuing mission to provide you with exceptional heart care, we have created designated Provider Care Teams.  These Care Teams include your primary Cardiologist (physician) and Advanced Practice Providers (APPs -  Physician Assistants and Nurse Practitioners) who all work together to provide you with the care you need, when you need it.  Your next  appointment:  1 month   The format for your next appointment:  Office   Provider:  Dr.Australia Droll   Check blood pressure daily   Call office in 2 weeks to report readings     Signed, Donato Heinz, MD  11/02/2018 8:55 AM    [place]

## 2018-11-02 NOTE — Patient Instructions (Addendum)
Medication Instructions:  Stop Aspirin Start Eliquis 5 mg twice a day  Start Lisinopril 10 mg daily  *If you need a refill on your cardiac medications before your next appointment, please call your pharmacy*  Lab Work: Bmet and TSH in 1 week  Lab order enclosed   Testing/Procedures: Schedule Echo Schedule Sleep Study  Follow-Up: At [facility], you and your health needs are our priority.  As part of our continuing mission to provide you with exceptional heart care, we have created designated Provider Care Teams.  These Care Teams include your primary Cardiologist (physician) and Advanced Practice Providers (APPs -  Physician Assistants and Nurse Practitioners) who all work together to provide you with the care you need, when you need it.  Your next appointment:  1 month   The format for your next appointment:  Office   Provider:  Dr.Schumann   Check blood pressure daily   Call office in 2 weeks to report readings

## 2018-11-06 ENCOUNTER — Telehealth: Payer: Self-pay | Admitting: Cardiology

## 2018-11-06 NOTE — Telephone Encounter (Signed)
Spoke with patient.  He remains in AF but rate has been well-controlled, in 70s last he checked.  He reports his symptoms are improved, not feeling as short of breath.  Since he is feeling improved and his rate is well-controlled, did not feel needed to go to ED, can wait and see if he converts.  Given he has now had a couple of AF episodes recently, and is fairly symptomatic in AF despite good rate control, question whether should change from sotalol to alternative antiarrhythmic vs consider ablation.  Can we schedule in the Afib clinic for later this week?

## 2018-11-06 NOTE — Telephone Encounter (Signed)
Pt aware of recommendations and appt made at the  AFIB clinic for 11/08/18 at 8:30 am .Adonis Housekeeper

## 2018-11-06 NOTE — Telephone Encounter (Signed)
Spoke with pt and remains in afib Pt feels HR is in mid 60's at this time but is SOB did not sleep last night could not lay flat Pt is willing to go to ED if needed for DCCV . Will forward to Dr Gardiner Rhyme for review and recommendations ./cy

## 2018-11-06 NOTE — Telephone Encounter (Signed)
° ° °  Patient calling to report he is in afib since about 1am today. 148/95 HR 73

## 2018-11-08 ENCOUNTER — Other Ambulatory Visit: Payer: Self-pay

## 2018-11-08 ENCOUNTER — Ambulatory Visit (HOSPITAL_COMMUNITY)
Admission: RE | Admit: 2018-11-08 | Discharge: 2018-11-08 | Disposition: A | Discharge status: Home / Self Care | Payer: Medicare HMO | Source: Ambulatory Visit | Attending: Physician Assistant | Admitting: Physician Assistant

## 2018-11-08 VITALS — BP 162/90 | HR 69 | Ht 70.0 in | Wt 253.6 lb

## 2018-11-08 DIAGNOSIS — Z87891 Personal history of nicotine dependence: Secondary | ICD-10-CM | POA: Insufficient documentation

## 2018-11-08 DIAGNOSIS — Z6836 Body mass index (BMI) 36.0-36.9, adult: Secondary | ICD-10-CM | POA: Insufficient documentation

## 2018-11-08 DIAGNOSIS — E669 Obesity, unspecified: Secondary | ICD-10-CM | POA: Diagnosis not present

## 2018-11-08 DIAGNOSIS — Z803 Family history of malignant neoplasm of breast: Secondary | ICD-10-CM | POA: Insufficient documentation

## 2018-11-08 DIAGNOSIS — G4730 Sleep apnea, unspecified: Secondary | ICD-10-CM | POA: Diagnosis not present

## 2018-11-08 DIAGNOSIS — I10 Essential (primary) hypertension: Secondary | ICD-10-CM | POA: Diagnosis not present

## 2018-11-08 DIAGNOSIS — I480 Paroxysmal atrial fibrillation: Secondary | ICD-10-CM | POA: Insufficient documentation

## 2018-11-08 DIAGNOSIS — R9431 Abnormal electrocardiogram [ECG] [EKG]: Secondary | ICD-10-CM | POA: Insufficient documentation

## 2018-11-08 DIAGNOSIS — Z79899 Other long term (current) drug therapy: Secondary | ICD-10-CM | POA: Diagnosis not present

## 2018-11-08 DIAGNOSIS — K219 Gastro-esophageal reflux disease without esophagitis: Secondary | ICD-10-CM | POA: Insufficient documentation

## 2018-11-08 DIAGNOSIS — D6869 Other thrombophilia: Secondary | ICD-10-CM | POA: Diagnosis not present

## 2018-11-08 DIAGNOSIS — Z7901 Long term (current) use of anticoagulants: Secondary | ICD-10-CM | POA: Insufficient documentation

## 2018-11-08 DIAGNOSIS — J45909 Unspecified asthma, uncomplicated: Secondary | ICD-10-CM | POA: Diagnosis not present

## 2018-11-08 DIAGNOSIS — R0683 Snoring: Secondary | ICD-10-CM | POA: Diagnosis not present

## 2018-11-08 DIAGNOSIS — Z8249 Family history of ischemic heart disease and other diseases of the circulatory system: Secondary | ICD-10-CM | POA: Insufficient documentation

## 2018-11-08 MED ORDER — DILTIAZEM HCL 30 MG PO TABS: ORAL_TABLET | ORAL | 1 refills | Status: DC

## 2018-11-08 NOTE — Progress Notes (Signed)
Primary Care Physician: Jearld Fenton, NP Primary Cardiologist: Dr Gardiner Rhyme Primary Electrophysiologist: none Referring Physician: Dr Lorrene Reid Dejoie is a 73 y.o. male with a history of paroxysmal atrial fibrillation and HTN who presents for consultation in the [facility].  The patient was initially diagnosed with atrial fibrillation in 2018 after presenting with symptoms of shortness of breath and an irregular pulse. He underwent two DCCV in 2018 and was started on sotalol. He had done well until 10/23/18 when he again had afib symptoms with heart rates in the 170s. He presented to the ER but converted to SR before being seen. He was seen by Dr Gardiner Rhyme and started on anticoagulation. He is on Eliquis for a CHADS2VASC score of 2. Patient does admit to snoring and witnessed apnea and a sleep study has been ordered. Patient reports that 11/05/18 he woke up in the middle of the night and felt he was back out of rhythm. He does have symptoms of fatigue with exertion and palpitations but overall feels well. He denies CP, dizziness, or SOB.  Today, he denies symptoms of chest pain, shortness of breath, orthopnea, PND, lower extremity edema, dizziness, presyncope, syncope, bleeding, or neurologic sequela. The patient is tolerating medications without difficulties and is otherwise without complaint today.    Atrial Fibrillation Risk Factors:  he does have symptoms or diagnosis of sleep apnea. he does not have a history of rheumatic fever. he does not have a history of alcohol use. The patient does not have a history of early familial atrial fibrillation or other arrhythmias.  he has a BMI of Body mass index is 36.39 kg/m.Marland Kitchen Filed Weights   11/08/18 0831  Weight: 115 kg    Family History  Problem Relation Age of Onset  . Heart attack Father   . Breast cancer Sister   . Arthritis Maternal Grandmother      Atrial Fibrillation Management history:   Previous antiarrhythmic drugs: sotalol Previous cardioversions: 2018 x2 Previous ablations: none CHADS2VASC score: 2 Anticoagulation history: Eliquis (started 11/02/18)   Past Medical History:  Diagnosis Date  . Atrial fibrillation ([place])   . Childhood asthma   . GERD (gastroesophageal reflux disease)   . History of chicken pox   . History of colon polyps   . Hyperlipidemia    No past surgical history on file.  Current Outpatient Medications  Medication Sig Dispense Refill  . apixaban (ELIQUIS) 5 MG TABS tablet Take 1 tablet (5 mg total) by mouth 2 (two) times daily. 180 tablet 3  . lisinopril (ZESTRIL) 10 MG tablet Take 1 tablet (10 mg total) by mouth daily. 90 tablet 3  . omeprazole (PRILOSEC) 20 MG capsule Take 20 mg by mouth daily.    . sotalol (BETAPACE) 120 MG tablet Take 1 tablet (120 mg total) by mouth 2 (two) times daily. 180 tablet 0  . diltiazem (CARDIZEM) 30 MG tablet Take 1 Tablet Every 4 Hours As Needed For HR <100 45 tablet 1   No current facility-administered medications for this encounter.     No Known Allergies  Social History   Socioeconomic History  . Marital status: Married    Spouse name: Not on file  . Number of children: Not on file  . Years of education: Not on file  . Highest education level: Not on file  Occupational History  . Not on file  Social Needs  . Financial resource strain: Not on file  . Food insecurity  Worry: Not on file    Inability: Not on file  . Transportation needs    Medical: Not on file    Non-medical: Not on file  Tobacco Use  . Smoking status: Former Smoker    Start date: 03/14/2017  . Smokeless tobacco: Never Used  Substance and Sexual Activity  . Alcohol use: Yes    Comment: daily liquor  . Drug use: No  . Sexual activity: Not on file  Lifestyle  . Physical activity    Days per week: Not on file    Minutes per session: Not on file  . Stress: Not on file  Relationships  . Social Herbalist on  phone: Not on file    Gets together: Not on file    Attends religious service: Not on file    Active member of club or organization: Not on file    Attends meetings of clubs or organizations: Not on file    Relationship status: Not on file  . Intimate partner violence    Fear of current or ex partner: Not on file    Emotionally abused: Not on file    Physically abused: Not on file    Forced sexual activity: Not on file  Other Topics Concern  . Not on file  Social History Narrative  . Not on file     ROS- All systems are reviewed and negative except as per the HPI above.  Physical Exam: Vitals:   11/08/18 0831  BP: (!) 162/90  Pulse: 69  Weight: 115 kg  Height: 5\' 10"  (1.778 m)    GEN- The patient is well appearing obese male, alert and oriented x 3 today.   Head- normocephalic, atraumatic Eyes-  Sclera clear, conjunctiva pink Ears- hearing intact Oropharynx- clear Neck- supple  Lungs- Clear to ausculation bilaterally, normal work of breathing Heart- irregular rate and rhythm, no murmurs, rubs or gallops  GI- soft, NT, ND, + BS Extremities- no clubbing, cyanosis, or edema MS- no significant deformity or atrophy Skin- no rash or lesion Psych- euthymic mood, full affect Neuro- strength and sensation are intact  Wt Readings from Last 3 Encounters:  11/08/18 115 kg  11/02/18 115.7 kg  10/31/18 116 kg    EKG today demonstrates afib HR 69, QRS 88, QTc 454   Epic records are reviewed at length today  Assessment and Plan:  1. Paroxysmal atrial fibrillation General education about afib provided and questions answered. We discussed his stroke risk and the risks and benefits of anticoagulation. We also discussed therapeutic options including changing AAD therapy vs ablation. Patient would like to be evaluated for ablation. Continue sotalol 120 mg BID. QT stable. Continue Eliquis 5 mg BID Will start diltiazem 30 mg PRN q4hrs for heart rate >100bpm. Echocardiogram  pending Lifestyle changes as below.   This patients CHA2DS2-VASc Score and unadjusted Ischemic Stroke Rate (% per year) is equal to 2.2 % stroke rate/year from a score of 2  Above score calculated as 1 point each if present [CHF, HTN, DM, Vascular=MI/PAD/Aortic Plaque, Age if 65-74, or Male] Above score calculated as 2 points each if present [Age > 75, or Stroke/TIA/TE]   2. Obesity Body mass index is 36.39 kg/m. Lifestyle modification was discussed at length including regular exercise and weight reduction.  3. Snoring/witnessed apnea The importance of adequate treatment of sleep apnea was discussed today in order to improve our ability to maintain sinus rhythm long term. Sleep study pending.  4. HTN Newly  diagnosed. Recently started on lisinopril.  Will hold on making further changes today.   Follow up with Dr Rayann Heman to be evaluated for afib ablation.    [facility] [address] [place], [place] [address] [phone] 11/08/2018 10:14 AM

## 2018-11-08 NOTE — Patient Instructions (Signed)
Take 1 Tablet Every 4 Hours As Needed For HR <100 and top number of BP <100 

## 2018-11-09 DIAGNOSIS — R0683 Snoring: Secondary | ICD-10-CM | POA: Diagnosis not present

## 2018-11-09 DIAGNOSIS — I480 Paroxysmal atrial fibrillation: Secondary | ICD-10-CM | POA: Diagnosis not present

## 2018-11-09 LAB — TSH: TSH: 1.5 u[IU]/mL (ref 0.450–4.500)

## 2018-11-09 LAB — BASIC METABOLIC PANEL
BUN/Creatinine Ratio: 14 (ref 10–24)
BUN: 14 mg/dL (ref 8–27)
CO2: 23 mmol/L (ref 20–29)
Calcium: 9 mg/dL (ref 8.6–10.2)
Chloride: 100 mmol/L (ref 96–106)
Creatinine, Ser: 1 mg/dL (ref 0.76–1.27)
GFR calc Af Amer: 86 mL/min/{1.73_m2} (ref >59)
GFR calc non Af Amer: 74 mL/min/{1.73_m2} (ref >59)
Glucose: 99 mg/dL (ref 65–99)
Potassium: 4.2 mmol/L (ref 3.5–5.2)
Sodium: 142 mmol/L (ref 134–144)

## 2018-11-12 ENCOUNTER — Other Ambulatory Visit: Payer: Self-pay

## 2018-11-12 ENCOUNTER — Ambulatory Visit (HOSPITAL_COMMUNITY): Payer: Medicare HMO | Attending: Internal Medicine

## 2018-11-12 DIAGNOSIS — I480 Paroxysmal atrial fibrillation: Secondary | ICD-10-CM | POA: Diagnosis not present

## 2018-11-15 ENCOUNTER — Ambulatory Visit: Payer: Medicare HMO | Admitting: Orthotics

## 2018-11-15 ENCOUNTER — Other Ambulatory Visit: Payer: Self-pay

## 2018-11-15 DIAGNOSIS — M7661 Achilles tendinitis, right leg: Secondary | ICD-10-CM

## 2018-11-15 DIAGNOSIS — Q6671 Congenital pes cavus, right foot: Secondary | ICD-10-CM

## 2018-11-15 NOTE — Progress Notes (Signed)
Patient came in today to pick up custom made foot orthotics.  The goals were accomplished and the patient reported no dissatisfaction with said orthotics.  Patient was advised of breakin period and how to report any issues. 

## 2018-11-16 ENCOUNTER — Encounter: Payer: Self-pay | Admitting: Internal Medicine

## 2018-11-16 ENCOUNTER — Telehealth (INDEPENDENT_AMBULATORY_CARE_PROVIDER_SITE_OTHER): Payer: Medicare HMO | Admitting: Internal Medicine

## 2018-11-16 ENCOUNTER — Telehealth: Payer: Self-pay | Admitting: *Deleted

## 2018-11-16 ENCOUNTER — Telehealth: Payer: Self-pay

## 2018-11-16 VITALS — BP 146/97 | HR 71 | Ht 70.0 in | Wt 253.0 lb

## 2018-11-16 DIAGNOSIS — R0683 Snoring: Secondary | ICD-10-CM | POA: Diagnosis not present

## 2018-11-16 DIAGNOSIS — I4819 Other persistent atrial fibrillation: Secondary | ICD-10-CM | POA: Diagnosis not present

## 2018-11-16 DIAGNOSIS — R5383 Other fatigue: Secondary | ICD-10-CM

## 2018-11-16 DIAGNOSIS — I10 Essential (primary) hypertension: Secondary | ICD-10-CM

## 2018-11-16 NOTE — Telephone Encounter (Signed)
-----   Message from Damian Leavell, RN sent at 11/16/2018 12:38 PM EST ----- Regarding: sleep study Pt needs sleep study per [name] for snoring/fatigue.  Sonia Baller

## 2018-11-16 NOTE — Telephone Encounter (Signed)
-----   Message from Thompson Grayer, MD sent at 11/16/2018 12:22 PM EST ----- Needs a sleep study   Afib ablation C/I/A  Cardiac CT

## 2018-11-16 NOTE — H&P (View-Only) (Signed)
Electrophysiology TeleHealth Note   Due to national recommendations of social distancing due to [place] 19, Audio/video telehealth visit is felt to be most appropriate for this patient at this time.  See MyChart message from today for patient consent regarding telehealth for Kent James.   Date:  11/16/2018   ID:  Kent James, DOB [birth year], MRN [id number]  Location: home Provider location: [address], [place] Evaluation Performed: New patient consult  PCP:  Kent Fenton, NP  Cardiologist:  Dr Gardiner Rhyme Electrophysiologist:  None   Chief Complaint:  afib  History of Present Illness:    Kent James is a 73 y.o. male who presents via audio/video conferencing for a telehealth visit today.   The patient is referred for new consultation regarding afib by Dr Gardiner Rhyme and the AF clinic.  he was initially diagnosed with afib 2018 after presenting with palpitations and SOB.  He required cardioversion at that time and was placed on sotalol.  He did well until 10/2018 when he developed recurrent afib with RVR.  He spontaneously converted to sinus rhythm. He is on eliquis for stroke prevention.  He continues to have occasional afib.  He has been in AF since 11/06/2018.  He reports significant SOB and reduced exercise tolerance since that time.  Today, he denies symptoms of palpitations, chest pain, shortness of breath, orthopnea, PND, lower extremity edema, claudication, dizziness, presyncope, syncope, bleeding, or neurologic sequela. The patient is tolerating medications without difficulties and is otherwise without complaint today.   he denies symptoms of cough, fevers, chills, or new SOB worrisome for COVID 19.   Past Medical History:  Diagnosis Date  . Childhood asthma   . Essential hypertension   . GERD (gastroesophageal reflux disease)   . History of chicken pox   . History of colon polyps   . Hyperlipidemia   . Persistent atrial fibrillation ([place])   .  Snoring     Past Surgical History:  Procedure Laterality Date  . CARDIOVERSION      Current Outpatient Medications  Medication Sig Dispense Refill  . apixaban (ELIQUIS) 5 MG TABS tablet Take 1 tablet (5 mg total) by mouth 2 (two) times daily. 180 tablet 3  . diltiazem (CARDIZEM) 30 MG tablet Take 1 Tablet Every 4 Hours As Needed For HR <100 45 tablet 1  . lisinopril (ZESTRIL) 10 MG tablet Take 1 tablet (10 mg total) by mouth daily. 90 tablet 3  . omeprazole (PRILOSEC) 20 MG capsule Take 20 mg by mouth daily.    . sotalol (BETAPACE) 120 MG tablet Take 1 tablet (120 mg total) by mouth 2 (two) times daily. 180 tablet 0   No current facility-administered medications for this visit.     Allergies:   Patient has no known allergies.   Social History:  The patient  reports that he has quit smoking. He started smoking about 20 months ago. He has never used smokeless tobacco. He reports current alcohol use. He reports that he does not use drugs.   Family History:  The patient's family history includes Arthritis in his maternal grandmother; Breast cancer in his sister; Heart attack in his father.    ROS:  Please see the history of present illness.   All other systems are personally reviewed and negative.    Exam:    Vital Signs:  BP (!) 146/97   Pulse 71   Ht 5\' 10"  (1.778 m)   Wt 253 lb (114.8 kg)   BMI  36.30 kg/m    Well sounding, alert and conversant   Labs/Other Tests and Data Reviewed:    Recent Labs: 12/04/2017: ALT 14 10/31/2018: Hemoglobin 16.9; Platelets 194 11/09/2018: BUN 14; Creatinine, Ser 1.00; Potassium 4.2; Sodium 142; TSH 1.500   Wt Readings from Last 3 Encounters:  11/16/18 253 lb (114.8 kg)  11/08/18 253 lb 9.6 oz (115 kg)  11/02/18 255 lb (115.7 kg)     Other studies personally reviewed: Additional studies/ records that were reviewed today include: AF clinic notes,  Echo 11/12/2018  Review of the above records today demonstrates: EF 65%, LA 4.1,  No valve  disease, moderate LVH  ASSESSMENT & PLAN:    1.  afib The patient has symptomatic, recurrent persistent atrial fibrillation. he has failed medical therapy with sotalol. Chads2vasc score is 2.  he is anticoagulated with eliquis . Therapeutic strategies for afib including medicine and ablation were discussed in detail with the patient today. Risk, benefits, and alternatives to EP study and radiofrequency ablation for afib were also discussed in detail today. These risks include but are not limited to stroke, bleeding, vascular damage, tamponade, perforation, damage to the esophagus, lungs, and other structures, pulmonary vein stenosis, worsening renal function, and death. The patient understands these risk and wishes to proceed.  We will therefore proceed with catheter ablation at the next available time.  Carto, ICE, anesthesia are requested for the procedure.  Will also obtain cardiac CT prior to the procedure to exclude LAA thrombus and further evaluate atrial anatomy.  2. HTN He has moderate LVH Appropriate control advised  3. Snoring and fatigue Sleep study ordered today  4. Obesity Lifestyle modification is advise  Current medicines are reviewed at length with the patient today.   The patient does not have concerns regarding his medicines.  The following changes were made today:  none  Labs/ tests ordered today include:  No orders of the defined types were placed in this encounter.   Patient Risk:  after full review of this patients clinical status, I feel that they are at high risk at this time.   Today, I have spent 20 minutes with the patient with telehealth technology discussing afib .    Signed, Kent Grayer MD, [facility] 11/16/2018 12:19 PM   [place] [address] [phone] (office) [phone] (fax)

## 2018-11-16 NOTE — Progress Notes (Signed)
Electrophysiology TeleHealth Note   Due to national recommendations of social distancing due to [place] 19, Audio/video telehealth visit is felt to be most appropriate for this patient at this time.  See MyChart message from today for patient consent regarding telehealth for Kent James.   Date:  11/16/2018   ID:  Kent James, DOB 30-Nov-1945, MRN [id number]  Location: home Provider location: [address], [place] Evaluation Performed: New patient consult  PCP:  Jearld Fenton, NP  Cardiologist:  Dr Gardiner Rhyme Electrophysiologist:  None   Chief Complaint:  afib  History of Present Illness:    Kent James is a 73 y.o. male who presents via audio/video conferencing for a telehealth visit today.   The patient is referred for new consultation regarding afib by Dr Gardiner Rhyme and the AF clinic.  he was initially diagnosed with afib 2018 after presenting with palpitations and SOB.  He required cardioversion at that time and was placed on sotalol.  He did well until 10/2018 when he developed recurrent afib with RVR.  He spontaneously converted to sinus rhythm. He is on eliquis for stroke prevention.  He continues to have occasional afib.  He has been in AF since 11/06/2018.  He reports significant SOB and reduced exercise tolerance since that time.  Today, he denies symptoms of palpitations, chest pain, shortness of breath, orthopnea, PND, lower extremity edema, claudication, dizziness, presyncope, syncope, bleeding, or neurologic sequela. The patient is tolerating medications without difficulties and is otherwise without complaint today.   he denies symptoms of cough, fevers, chills, or new SOB worrisome for COVID 19.   Past Medical History:  Diagnosis Date  . Childhood asthma   . Essential hypertension   . GERD (gastroesophageal reflux disease)   . History of chicken pox   . History of colon polyps   . Hyperlipidemia   . Persistent atrial fibrillation ([place])   .  Snoring     Past Surgical History:  Procedure Laterality Date  . CARDIOVERSION      Current Outpatient Medications  Medication Sig Dispense Refill  . apixaban (ELIQUIS) 5 MG TABS tablet Take 1 tablet (5 mg total) by mouth 2 (two) times daily. 180 tablet 3  . diltiazem (CARDIZEM) 30 MG tablet Take 1 Tablet Every 4 Hours As Needed For HR <100 45 tablet 1  . lisinopril (ZESTRIL) 10 MG tablet Take 1 tablet (10 mg total) by mouth daily. 90 tablet 3  . omeprazole (PRILOSEC) 20 MG capsule Take 20 mg by mouth daily.    . sotalol (BETAPACE) 120 MG tablet Take 1 tablet (120 mg total) by mouth 2 (two) times daily. 180 tablet 0   No current facility-administered medications for this visit.     Allergies:   Patient has no known allergies.   Social History:  The patient  reports that he has quit smoking. He started smoking about 20 months ago. He has never used smokeless tobacco. He reports current alcohol use. He reports that he does not use drugs.   Family History:  The patient's family history includes Arthritis in his maternal grandmother; Breast cancer in his sister; Heart attack in his father.    ROS:  Please see the history of present illness.   All other systems are personally reviewed and negative.    Exam:    Vital Signs:  BP (!) 146/97   Pulse 71   Ht 5\' 10"  (1.778 m)   Wt 253 lb (114.8 kg)   BMI  36.30 kg/m    Well sounding, alert and conversant   Labs/Other Tests and Data Reviewed:    Recent Labs: 12/04/2017: ALT 14 10/31/2018: Hemoglobin 16.9; Platelets 194 11/09/2018: BUN 14; Creatinine, Ser 1.00; Potassium 4.2; Sodium 142; TSH 1.500   Wt Readings from Last 3 Encounters:  11/16/18 253 lb (114.8 kg)  11/08/18 253 lb 9.6 oz (115 kg)  11/02/18 255 lb (115.7 kg)     Other studies personally reviewed: Additional studies/ records that were reviewed today include: AF clinic notes,  Echo 11/12/2018  Review of the above records today demonstrates: EF 65%, LA 4.1,  No valve  disease, moderate LVH  ASSESSMENT & PLAN:    1.  afib The patient has symptomatic, recurrent persistent atrial fibrillation. he has failed medical therapy with sotalol. Chads2vasc score is 2.  he is anticoagulated with eliquis . Therapeutic strategies for afib including medicine and ablation were discussed in detail with the patient today. Risk, benefits, and alternatives to EP study and radiofrequency ablation for afib were also discussed in detail today. These risks include but are not limited to stroke, bleeding, vascular damage, tamponade, perforation, damage to the esophagus, lungs, and other structures, pulmonary vein stenosis, worsening renal function, and death. The patient understands these risk and wishes to proceed.  We will therefore proceed with catheter ablation at the next available time.  Carto, ICE, anesthesia are requested for the procedure.  Will also obtain cardiac CT prior to the procedure to exclude LAA thrombus and further evaluate atrial anatomy.  2. HTN He has moderate LVH Appropriate control advised  3. Snoring and fatigue Sleep study ordered today  4. Obesity Lifestyle modification is advise  Current medicines are reviewed at length with the patient today.   The patient does not have concerns regarding his medicines.  The following changes were made today:  none  Labs/ tests ordered today include:  No orders of the defined types were placed in this encounter.   Patient Risk:  after full review of this patients clinical status, I feel that they are at high risk at this time.   Today, I have spent 20 minutes with the patient with telehealth technology discussing afib .    Signed, Thompson Grayer MD, [facility] 11/16/2018 12:19 PM   [place] [address] [phone] (office) [phone] (fax)

## 2018-11-16 NOTE — Telephone Encounter (Signed)
Did NOT order sleep study d/t already ordered by Dr. Gardiner Rhyme.  Sleep study pool is aware (staff messaged received).  Advised Pt is wondering why he hasn't been contacted to schedule yet.   Pt is scheduled for afib ablation on December 11, 2018.  Work up complete.  Instruction letters sent via mychart.

## 2018-11-23 ENCOUNTER — Other Ambulatory Visit: Payer: Self-pay

## 2018-11-23 ENCOUNTER — Other Ambulatory Visit: Payer: Medicare HMO | Admitting: *Deleted

## 2018-11-23 DIAGNOSIS — I4819 Other persistent atrial fibrillation: Secondary | ICD-10-CM

## 2018-11-23 DIAGNOSIS — R0683 Snoring: Secondary | ICD-10-CM

## 2018-11-23 DIAGNOSIS — R5383 Other fatigue: Secondary | ICD-10-CM

## 2018-11-23 LAB — CBC WITH DIFFERENTIAL/PLATELET
Basophils Absolute: 0.1 10*3/uL (ref 0.0–0.2)
Basos: 1 %
EOS (ABSOLUTE): 0.3 10*3/uL (ref 0.0–0.4)
Eos: 4 %
Hematocrit: 48.1 % (ref 37.5–51.0)
Hemoglobin: 17.1 g/dL (ref 13.0–17.7)
Immature Grans (Abs): 0 10*3/uL (ref 0.0–0.1)
Immature Granulocytes: 0 %
Lymphocytes Absolute: 1.2 10*3/uL (ref 0.7–3.1)
Lymphs: 18 %
MCH: 30.9 pg (ref 26.6–33.0)
MCHC: 35.6 g/dL (ref 31.5–35.7)
MCV: 87 fL (ref 79–97)
Monocytes Absolute: 0.6 10*3/uL (ref 0.1–0.9)
Monocytes: 8 %
Neutrophils Absolute: 4.8 10*3/uL (ref 1.4–7.0)
Neutrophils: 69 %
Platelets: 188 10*3/uL (ref 150–450)
RBC: 5.53 x10E6/uL (ref 4.14–5.80)
RDW: 13.1 % (ref 11.6–15.4)
WBC: 7 10*3/uL (ref 3.4–10.8)

## 2018-11-23 LAB — BASIC METABOLIC PANEL
BUN/Creatinine Ratio: 12 (ref 10–24)
BUN: 13 mg/dL (ref 8–27)
CO2: 25 mmol/L (ref 20–29)
Calcium: 9.4 mg/dL (ref 8.6–10.2)
Chloride: 102 mmol/L (ref 96–106)
Creatinine, Ser: 1.05 mg/dL (ref 0.76–1.27)
GFR calc Af Amer: 81 mL/min/{1.73_m2} (ref >59)
GFR calc non Af Amer: 70 mL/min/{1.73_m2} (ref >59)
Glucose: 102 mg/dL — ABNORMAL HIGH (ref 65–99)
Potassium: 4.4 mmol/L (ref 3.5–5.2)
Sodium: 139 mmol/L (ref 134–144)

## 2018-12-01 ENCOUNTER — Other Ambulatory Visit (HOSPITAL_COMMUNITY): Payer: Self-pay | Admitting: Physician Assistant

## 2018-12-03 ENCOUNTER — Telehealth: Payer: Self-pay | Admitting: *Deleted

## 2018-12-03 NOTE — Telephone Encounter (Signed)
PA for in lab sleep study submitted to [employer] via web portal.

## 2018-12-04 ENCOUNTER — Telehealth (HOSPITAL_COMMUNITY): Payer: Self-pay | Admitting: Emergency Medicine

## 2018-12-04 NOTE — Telephone Encounter (Signed)
Left message on voicemail with name and callback number Shiya Fogelman RN Navigator Cardiac Imaging [facility] [phone] Office [phone] Cell  

## 2018-12-05 ENCOUNTER — Ambulatory Visit (HOSPITAL_COMMUNITY)
Admission: RE | Admit: 2018-12-05 | Discharge: 2018-12-05 | Disposition: A | Discharge status: Home / Self Care | Payer: Medicare HMO | Source: Ambulatory Visit | Attending: Internal Medicine | Admitting: Internal Medicine

## 2018-12-05 ENCOUNTER — Other Ambulatory Visit: Payer: Self-pay

## 2018-12-05 ENCOUNTER — Other Ambulatory Visit (HOSPITAL_COMMUNITY): Payer: Medicare HMO

## 2018-12-05 DIAGNOSIS — I4819 Other persistent atrial fibrillation: Secondary | ICD-10-CM | POA: Insufficient documentation

## 2018-12-05 MED ORDER — IOHEXOL 350 MG/ML SOLN
100.0000 mL | Freq: Once | INTRAVENOUS | Status: AC | PRN
  Administered 2018-12-05: 100 mL via INTRAVENOUS

## 2018-12-06 ENCOUNTER — Ambulatory Visit: Payer: Medicare HMO | Admitting: Cardiology

## 2018-12-06 ENCOUNTER — Telehealth: Payer: Self-pay

## 2018-12-06 ENCOUNTER — Encounter: Payer: Medicare HMO | Admitting: Internal Medicine

## 2018-12-06 NOTE — Telephone Encounter (Signed)
Call placed to Pt.  Advised to arrive to [facility] on 12/11/2018 at 7:30 am to prepare for TEE at 8:30 am.  Explained results of cardiac CT.  Pt disagrees with report that states he was in afib.  States he was not.  All questions answered.

## 2018-12-06 NOTE — Telephone Encounter (Signed)
-----   Message from Thompson Grayer, MD sent at 12/05/2018 10:07 PM EST ----- Results reviewed.  Kent James, please inform pt of result.  Please arrange TEE the day prior to his ablation if possible.  Some times the tip of the appendage just doesn't fill with contrast completely.  I doubt that he has a thrombus.  Encourage compliance with anticoagulation.

## 2018-12-07 ENCOUNTER — Other Ambulatory Visit (HOSPITAL_COMMUNITY)
Admission: RE | Admit: 2018-12-07 | Discharge: 2018-12-07 | Disposition: A | Discharge status: Home / Self Care | Payer: Medicare HMO | Source: Ambulatory Visit | Attending: Internal Medicine | Admitting: Internal Medicine

## 2018-12-07 ENCOUNTER — Telehealth: Payer: Self-pay | Admitting: *Deleted

## 2018-12-07 DIAGNOSIS — Z20828 Contact with and (suspected) exposure to other viral communicable diseases: Secondary | ICD-10-CM | POA: Insufficient documentation

## 2018-12-07 DIAGNOSIS — Z01812 Encounter for preprocedural laboratory examination: Secondary | ICD-10-CM | POA: Diagnosis not present

## 2018-12-07 NOTE — Telephone Encounter (Signed)
Sleep study and [place] appointment details left on patient's voicemail. Ok per dpr.

## 2018-12-08 LAB — NOVEL CORONAVIRUS, NAA (HOSP ORDER, SEND-OUT TO REF LAB; TAT 18-24 HRS): SARS-CoV-2, NAA: NOT DETECTED

## 2018-12-10 ENCOUNTER — Telehealth: Payer: Self-pay | Admitting: Internal Medicine

## 2018-12-10 NOTE — Telephone Encounter (Signed)
New message   Patient calling to verify 12/8 date and instructions. Please call

## 2018-12-10 NOTE — Anesthesia Preprocedure Evaluation (Addendum)
Anesthesia Evaluation  Patient identified by MRN, date of birth, ID band Patient awake    Reviewed: Allergy & Precautions, NPO status , Patient's Chart, lab work & pertinent test results, reviewed documented beta blocker date and time   History of Anesthesia Complications Negative for: history of anesthetic complications  Airway Mallampati: II  TM Distance: >3 FB Neck ROM: Full    Dental  (+) Edentulous Lower, Edentulous Upper   Pulmonary asthma , former smoker,    Pulmonary exam normal        Cardiovascular hypertension, Pt. on medications and Pt. on home beta blockers + dysrhythmias Atrial Fibrillation  Rhythm:Irregular Rate:Normal  TTE 11/20: EF 65-70%, LVH   Neuro/Psych negative neurological ROS  negative psych ROS   GI/Hepatic Neg liver ROS, GERD  Medicated and Controlled,  Endo/Other  negative endocrine ROS  Renal/GU negative Renal ROS  negative genitourinary   Musculoskeletal negative musculoskeletal ROS (+)   Abdominal   Peds  Hematology negative hematology ROS (+)   Anesthesia Other Findings Day of surgery medications reviewed with patient.  Reproductive/Obstetrics negative OB ROS                            Anesthesia Physical Anesthesia Plan  ASA: III  Anesthesia Plan: General   Post-op Pain Management:    Induction: Intravenous  PONV Risk Score and Plan: 2 and Treatment may vary due to age or medical condition, Ondansetron and Dexamethasone  Airway Management Planned: Oral ETT  Additional Equipment: None  Intra-op Plan:   Post-operative Plan: Extubation in OR  Informed Consent: I have reviewed the patients History and Physical, chart, labs and discussed the procedure including the risks, benefits and alternatives for the proposed anesthesia with the patient or authorized representative who has indicated his/her understanding and acceptance.     Dental advisory  given  Plan Discussed with: CRNA  Anesthesia Plan Comments:        Anesthesia Quick Evaluation

## 2018-12-10 NOTE — Progress Notes (Signed)
Pre -op call attempted for procedure tomorrow 12/8. No answer, automated VM.

## 2018-12-11 ENCOUNTER — Encounter (HOSPITAL_COMMUNITY)
Admission: RE | Disposition: A | Discharge status: Home / Self Care | Payer: Self-pay | Source: Home / Self Care | Attending: Internal Medicine

## 2018-12-11 ENCOUNTER — Ambulatory Visit (HOSPITAL_COMMUNITY)
Admission: RE | Admit: 2018-12-11 | Discharge: 2018-12-11 | Disposition: A | Discharge status: Home / Self Care | Payer: Medicare HMO | Attending: Internal Medicine | Admitting: Internal Medicine

## 2018-12-11 ENCOUNTER — Ambulatory Visit (HOSPITAL_COMMUNITY): Payer: Medicare HMO | Admitting: Anesthesiology

## 2018-12-11 ENCOUNTER — Other Ambulatory Visit: Payer: Self-pay

## 2018-12-11 ENCOUNTER — Encounter (HOSPITAL_COMMUNITY): Payer: Self-pay

## 2018-12-11 ENCOUNTER — Ambulatory Visit (HOSPITAL_BASED_OUTPATIENT_CLINIC_OR_DEPARTMENT_OTHER): Payer: Medicare HMO

## 2018-12-11 DIAGNOSIS — Z8249 Family history of ischemic heart disease and other diseases of the circulatory system: Secondary | ICD-10-CM | POA: Diagnosis not present

## 2018-12-11 DIAGNOSIS — I4891 Unspecified atrial fibrillation: Secondary | ICD-10-CM | POA: Diagnosis not present

## 2018-12-11 DIAGNOSIS — I4819 Other persistent atrial fibrillation: Secondary | ICD-10-CM | POA: Diagnosis not present

## 2018-12-11 DIAGNOSIS — R0683 Snoring: Secondary | ICD-10-CM | POA: Diagnosis not present

## 2018-12-11 DIAGNOSIS — Z87891 Personal history of nicotine dependence: Secondary | ICD-10-CM | POA: Diagnosis not present

## 2018-12-11 DIAGNOSIS — E669 Obesity, unspecified: Secondary | ICD-10-CM | POA: Insufficient documentation

## 2018-12-11 DIAGNOSIS — I10 Essential (primary) hypertension: Secondary | ICD-10-CM | POA: Insufficient documentation

## 2018-12-11 DIAGNOSIS — Z79899 Other long term (current) drug therapy: Secondary | ICD-10-CM | POA: Diagnosis not present

## 2018-12-11 DIAGNOSIS — R5383 Other fatigue: Secondary | ICD-10-CM | POA: Diagnosis not present

## 2018-12-11 DIAGNOSIS — I480 Paroxysmal atrial fibrillation: Secondary | ICD-10-CM | POA: Diagnosis not present

## 2018-12-11 DIAGNOSIS — E785 Hyperlipidemia, unspecified: Secondary | ICD-10-CM | POA: Diagnosis not present

## 2018-12-11 DIAGNOSIS — K219 Gastro-esophageal reflux disease without esophagitis: Secondary | ICD-10-CM | POA: Insufficient documentation

## 2018-12-11 DIAGNOSIS — Z7901 Long term (current) use of anticoagulants: Secondary | ICD-10-CM | POA: Insufficient documentation

## 2018-12-11 DIAGNOSIS — E7800 Pure hypercholesterolemia, unspecified: Secondary | ICD-10-CM | POA: Diagnosis not present

## 2018-12-11 HISTORY — PX: TEE WITHOUT CARDIOVERSION: SHX5443

## 2018-12-11 HISTORY — PX: ATRIAL FIBRILLATION ABLATION: EP1191

## 2018-12-11 SURGERY — ECHOCARDIOGRAM, TRANSESOPHAGEAL
Anesthesia: Moderate Sedation

## 2018-12-11 SURGERY — ATRIAL FIBRILLATION ABLATION
Anesthesia: General

## 2018-12-11 MED ORDER — ISOPROTERENOL HCL 0.2 MG/ML IJ SOLN
INTRAMUSCULAR | Status: AC
  Filled 2018-12-11: qty 5

## 2018-12-11 MED ORDER — ROCURONIUM BROMIDE 10 MG/ML (PF) SYRINGE
PREFILLED_SYRINGE | INTRAVENOUS | Status: DC | PRN
  Administered 2018-12-11: 50 mg via INTRAVENOUS

## 2018-12-11 MED ORDER — HEPARIN SODIUM (PORCINE) 1000 UNIT/ML IJ SOLN
INTRAMUSCULAR | Status: DC | PRN
  Administered 2018-12-11: 2000 [IU] via INTRAVENOUS
  Administered 2018-12-11: 3000 [IU] via INTRAVENOUS

## 2018-12-11 MED ORDER — MIDAZOLAM HCL (PF) 5 MG/ML IJ SOLN
INTRAMUSCULAR | Status: AC
  Filled 2018-12-11: qty 2

## 2018-12-11 MED ORDER — SODIUM CHLORIDE 0.9 % IV SOLN: 250.0000 mL | INTRAVENOUS | Status: DC | PRN

## 2018-12-11 MED ORDER — DEXAMETHASONE SODIUM PHOSPHATE 10 MG/ML IJ SOLN
INTRAMUSCULAR | Status: DC | PRN
  Administered 2018-12-11: 5 mg via INTRAVENOUS

## 2018-12-11 MED ORDER — MIDAZOLAM HCL 2 MG/2ML IJ SOLN
INTRAMUSCULAR | Status: DC | PRN
  Administered 2018-12-11: 2 mg via INTRAVENOUS

## 2018-12-11 MED ORDER — ONDANSETRON HCL 4 MG/2ML IJ SOLN
INTRAMUSCULAR | Status: DC | PRN
  Administered 2018-12-11: 4 mg via INTRAVENOUS

## 2018-12-11 MED ORDER — FENTANYL CITRATE (PF) 100 MCG/2ML IJ SOLN
INTRAMUSCULAR | Status: AC
  Filled 2018-12-11: qty 4

## 2018-12-11 MED ORDER — SODIUM CHLORIDE 0.9% FLUSH: 3.0000 mL | Freq: Two times a day (BID) | INTRAVENOUS | Status: DC

## 2018-12-11 MED ORDER — PROTAMINE SULFATE 10 MG/ML IV SOLN
INTRAVENOUS | Status: DC | PRN
  Administered 2018-12-11: 40 mg via INTRAVENOUS

## 2018-12-11 MED ORDER — LIDOCAINE VISCOUS HCL 2 % MT SOLN
OROMUCOSAL | Status: DC | PRN
  Administered 2018-12-11: 15 mL via OROMUCOSAL

## 2018-12-11 MED ORDER — ACETAMINOPHEN 500 MG PO TABS
ORAL_TABLET | ORAL | Status: AC
  Filled 2018-12-11: qty 2

## 2018-12-11 MED ORDER — APIXABAN 5 MG PO TABS
5.0000 mg | ORAL_TABLET | Freq: Once | ORAL | Status: AC
  Administered 2018-12-11: 5 mg via ORAL
  Filled 2018-12-11: qty 1

## 2018-12-11 MED ORDER — FENTANYL CITRATE (PF) 100 MCG/2ML IJ SOLN
INTRAMUSCULAR | Status: DC | PRN
  Administered 2018-12-11 (×5): 25 ug via INTRAVENOUS

## 2018-12-11 MED ORDER — FENTANYL CITRATE (PF) 250 MCG/5ML IJ SOLN
INTRAMUSCULAR | Status: DC | PRN
  Administered 2018-12-11: 100 ug via INTRAVENOUS

## 2018-12-11 MED ORDER — ACETAMINOPHEN 325 MG PO TABS: 650.0000 mg | ORAL_TABLET | ORAL | Status: DC | PRN

## 2018-12-11 MED ORDER — EPHEDRINE SULFATE-NACL 50-0.9 MG/10ML-% IV SOSY
PREFILLED_SYRINGE | INTRAVENOUS | Status: DC | PRN
  Administered 2018-12-11: 5 mg via INTRAVENOUS

## 2018-12-11 MED ORDER — MIDAZOLAM HCL (PF) 10 MG/2ML IJ SOLN
INTRAMUSCULAR | Status: DC | PRN
  Administered 2018-12-11 (×2): 2 mg via INTRAVENOUS
  Administered 2018-12-11: 1 mg via INTRAVENOUS

## 2018-12-11 MED ORDER — ACETAMINOPHEN 500 MG PO TABS
1000.0000 mg | ORAL_TABLET | Freq: Once | ORAL | Status: AC
  Administered 2018-12-11: 1000 mg via ORAL

## 2018-12-11 MED ORDER — SODIUM CHLORIDE 0.9 % IV SOLN
INTRAVENOUS | Status: DC
  Administered 2018-12-11: 08:00:00 via INTRAVENOUS

## 2018-12-11 MED ORDER — HEPARIN SODIUM (PORCINE) 1000 UNIT/ML IJ SOLN
INTRAMUSCULAR | Status: DC | PRN
  Administered 2018-12-11: 14000 [IU] via INTRAVENOUS
  Administered 2018-12-11: 1000 [IU] via INTRAVENOUS

## 2018-12-11 MED ORDER — PHENYLEPHRINE HCL-NACL 10-0.9 MG/250ML-% IV SOLN
INTRAVENOUS | Status: DC | PRN
  Administered 2018-12-11: 25 ug/min via INTRAVENOUS

## 2018-12-11 MED ORDER — PROMETHAZINE HCL 25 MG/ML IJ SOLN: 6.2500 mg | INTRAMUSCULAR | Status: DC | PRN

## 2018-12-11 MED ORDER — LIDOCAINE 2% (20 MG/ML) 5 ML SYRINGE
INTRAMUSCULAR | Status: DC | PRN
  Administered 2018-12-11: 80 mg via INTRAVENOUS

## 2018-12-11 MED ORDER — BUPIVACAINE HCL (PF) 0.25 % IJ SOLN
INTRAMUSCULAR | Status: DC | PRN
  Administered 2018-12-11: 20 mL

## 2018-12-11 MED ORDER — LACTATED RINGERS IV SOLN
INTRAVENOUS | Status: DC | PRN
  Administered 2018-12-11: 11:00:00 via INTRAVENOUS

## 2018-12-11 MED ORDER — HEPARIN SODIUM (PORCINE) 1000 UNIT/ML IJ SOLN
INTRAMUSCULAR | Status: AC
  Filled 2018-12-11: qty 1

## 2018-12-11 MED ORDER — PANTOPRAZOLE SODIUM 40 MG PO TBEC: 40.0000 mg | DELAYED_RELEASE_TABLET | Freq: Every day | ORAL | 0 refills | Status: DC

## 2018-12-11 MED ORDER — ONDANSETRON HCL 4 MG/2ML IJ SOLN: 4.0000 mg | Freq: Four times a day (QID) | INTRAMUSCULAR | Status: DC | PRN

## 2018-12-11 MED ORDER — HEPARIN (PORCINE) IN NACL 1000-0.9 UT/500ML-% IV SOLN
INTRAVENOUS | Status: DC | PRN
  Administered 2018-12-11: 500 mL

## 2018-12-11 MED ORDER — PERFLUTREN LIPID MICROSPHERE
INTRAVENOUS | Status: DC | PRN
  Administered 2018-12-11 (×2): 10 mL via INTRAVENOUS

## 2018-12-11 MED ORDER — PERFLUTREN LIPID MICROSPHERE
1.0000 mL | INTRAVENOUS | Status: AC | PRN
  Administered 2018-12-11: 20 mL via INTRAVENOUS
  Filled 2018-12-11: qty 10

## 2018-12-11 MED ORDER — FENTANYL CITRATE (PF) 100 MCG/2ML IJ SOLN: 25.0000 ug | INTRAMUSCULAR | Status: DC | PRN

## 2018-12-11 MED ORDER — PROPOFOL 10 MG/ML IV BOLUS
INTRAVENOUS | Status: DC | PRN
  Administered 2018-12-11: 140 mg via INTRAVENOUS

## 2018-12-11 MED ORDER — SODIUM CHLORIDE 0.9% FLUSH: 3.0000 mL | INTRAVENOUS | Status: DC | PRN

## 2018-12-11 MED ORDER — HEPARIN (PORCINE) IN NACL 1000-0.9 UT/500ML-% IV SOLN
INTRAVENOUS | Status: AC
  Filled 2018-12-11: qty 500

## 2018-12-11 MED ORDER — SODIUM CHLORIDE 0.9 % IV SOLN: INTRAVENOUS | Status: DC

## 2018-12-11 MED ORDER — SUGAMMADEX SODIUM 200 MG/2ML IV SOLN
INTRAVENOUS | Status: DC | PRN
  Administered 2018-12-11: 231.4 mg via INTRAVENOUS

## 2018-12-11 MED ORDER — HYDROCODONE-ACETAMINOPHEN 5-325 MG PO TABS
ORAL_TABLET | ORAL | Status: AC
  Filled 2018-12-11: qty 1

## 2018-12-11 MED ORDER — PHENYLEPHRINE 40 MCG/ML (10ML) SYRINGE FOR IV PUSH (FOR BLOOD PRESSURE SUPPORT)
PREFILLED_SYRINGE | INTRAVENOUS | Status: DC | PRN
  Administered 2018-12-11: 80 ug via INTRAVENOUS

## 2018-12-11 MED ORDER — LIDOCAINE VISCOUS HCL 2 % MT SOLN
OROMUCOSAL | Status: AC
  Filled 2018-12-11: qty 15

## 2018-12-11 MED ORDER — BUPIVACAINE HCL (PF) 0.25 % IJ SOLN
INTRAMUSCULAR | Status: AC
  Filled 2018-12-11: qty 30

## 2018-12-11 MED ORDER — ISOPROTERENOL HCL 0.2 MG/ML IJ SOLN
INTRAVENOUS | Status: DC | PRN
  Administered 2018-12-11: 20 ug/min via INTRAVENOUS

## 2018-12-11 MED ORDER — HYDROCODONE-ACETAMINOPHEN 5-325 MG PO TABS
1.0000 | ORAL_TABLET | ORAL | Status: DC | PRN
  Administered 2018-12-11: 1 via ORAL

## 2018-12-11 SURGICAL SUPPLY — 19 items
BLANKET WARM UNDERBOD FULL ACC (MISCELLANEOUS) ×2 IMPLANT
CATH MAPPNG PENTARAY F 2-6-2MM (CATHETERS) ×1 IMPLANT
CATH SMTCH THERMOCOOL SF DF (CATHETERS) ×2 IMPLANT
CATH SOUNDSTAR ECO REPROCESSED (CATHETERS) ×2 IMPLANT
CATH WEBSTER BI DIR CS D-F CRV (CATHETERS) ×2 IMPLANT
COVER SWIFTLINK CONNECTOR (BAG) ×2 IMPLANT
DEVICE CLOSURE PERCLS PRGLD 6F (VASCULAR PRODUCTS) ×3 IMPLANT
NEEDLE BAYLIS TRANSSEPTAL 71CM (NEEDLE) ×2 IMPLANT
PACK EP LATEX FREE (CUSTOM PROCEDURE TRAY) ×1
PACK EP LF (CUSTOM PROCEDURE TRAY) ×1 IMPLANT
PAD PRO RADIOLUCENT 2001M-C (PAD) ×2 IMPLANT
PATCH CARTO3 (PAD) ×2 IMPLANT
PENTARAY F 2-6-2MM (CATHETERS) ×2
PERCLOSE PROGLIDE 6F (VASCULAR PRODUCTS) ×6
SHEATH AVANTI 11F 11CM (SHEATH) ×2 IMPLANT
SHEATH PINNACLE 7F 10CM (SHEATH) ×4 IMPLANT
SHEATH PROBE COVER 6X72 (BAG) ×2 IMPLANT
SHEATH SWARTZ TS SL2 63CM 8.5F (SHEATH) ×2 IMPLANT
TUBING SMART ABLATE COOLFLOW (TUBING) ×4 IMPLANT

## 2018-12-11 NOTE — Interval H&P Note (Signed)
History and Physical Interval Note:  12/11/2018 7:50 AM  [name]  has presented today for surgery, with the diagnosis of Atrial fibrillation.  The various methods of treatment have been discussed with the patient and family. After consideration of risks, benefits and other options for treatment, the patient has consented to  Procedure(s): ATRIAL FIBRILLATION ABLATION (N/A) as a surgical intervention.  The patient's history has been reviewed, patient examined, no change in status, stable for surgery.  I have reviewed the patient's chart and labs.  Questions were answered to the patient's satisfaction.     Risk, benefits, and alternatives to EP study and radiofrequency ablation for afib were also discussed in detail today. These risks include but are not limited to stroke, bleeding, vascular damage, tamponade, perforation, damage to the esophagus, lungs, and other structures, pulmonary vein stenosis, worsening renal function, and death. The patient understands these risk and wishes to proceed.  Cardiac CT revealed underfilling of the left atrial appendage.  The patient and I have discussed at length today.  He reports compliance with eliquis without interruption. Will proceed with TEE.  If no thrombus, then we will proceed with ablation.   Thompson Grayer

## 2018-12-11 NOTE — Anesthesia Procedure Notes (Signed)
Procedure Name: Intubation Date/Time: 12/11/2018 11:09 AM Performed by: Kathryne Hitch, CRNA Pre-anesthesia Checklist: Patient identified, Emergency Drugs available, Suction available and Patient being monitored Patient Re-evaluated:Patient Re-evaluated prior to induction Oxygen Delivery Method: Circle system utilized Preoxygenation: Pre-oxygenation with 100% oxygen Induction Type: IV induction Ventilation: Mask ventilation without difficulty Laryngoscope Size: Miller and 2 Grade View: Grade I Tube type: Oral Number of attempts: 1 Airway Equipment and Method: Stylet and Oral airway Placement Confirmation: ETT inserted through vocal cords under direct vision,  positive ETCO2 and breath sounds checked- equal and bilateral Secured at: 23 cm Tube secured with: Tape Dental Injury: Teeth and Oropharynx as per pre-operative assessment

## 2018-12-11 NOTE — Progress Notes (Signed)
  Echocardiogram 2D Echocardiogram has been performed.  Kent James 12/11/2018, 9:41 AM

## 2018-12-11 NOTE — Interval H&P Note (Signed)
History and Physical Interval Note:  12/11/2018 8:15 AM  [name]  has presented today for surgery, with the diagnosis of r/o thrombus.  The various methods of treatment have been discussed with the patient and family. After consideration of risks, benefits and other options for treatment, the patient has consented to  Procedure(s): TRANSESOPHAGEAL ECHOCARDIOGRAM (TEE) (N/A) as a surgical intervention.  The patient's history has been reviewed, patient examined, no change in status, stable for surgery.  I have reviewed the patient's chart and labs.  Questions were answered to the patient's satisfaction.     Kent James

## 2018-12-11 NOTE — Anesthesia Postprocedure Evaluation (Signed)
Anesthesia Post Note  Patient: Taurus Majkut  Procedure(s) Performed: ATRIAL FIBRILLATION ABLATION (N/A )     Patient location during evaluation: PACU Anesthesia Type: General Level of consciousness: awake and alert Pain management: pain level controlled Vital Signs Assessment: post-procedure vital signs reviewed and stable Respiratory status: spontaneous breathing, nonlabored ventilation and respiratory function stable Cardiovascular status: blood pressure returned to baseline and stable Postop Assessment: no apparent nausea or vomiting Anesthetic complications: no    Last Vitals:  Vitals:   12/11/18 1700 12/11/18 1800  BP: (!) 142/85 136/75  Pulse: (!) 54 (!) 58  Resp: 18 (!) 23  Temp:    SpO2: 93% 98%    Last Pain:  Vitals:   12/11/18 1444  TempSrc: Temporal  PainSc:                  Audry Pili

## 2018-12-11 NOTE — Transfer of Care (Signed)
Immediate Anesthesia Transfer of Care Note  Patient: Kent James  Procedure(s) Performed: ATRIAL FIBRILLATION ABLATION (N/A )  Patient Location: Cath Lab  Anesthesia Type:General  Level of Consciousness: drowsy and patient cooperative  Airway & Oxygen Therapy: Patient Spontanous Breathing and Patient connected to face mask oxygen  Post-op Assessment: Report given to RN and Post -op Vital signs reviewed and stable  Post vital signs: Reviewed and stable  Last Vitals:  Vitals Value Taken Time  BP 160/85 12/11/18 1401  Temp    Pulse 65 12/11/18 1401  Resp 19 12/11/18 1401  SpO2 98 % 12/11/18 1401  Vitals shown include unvalidated device data.  Last Pain:  Vitals:   12/11/18 0935  TempSrc:   PainSc: 0-No pain         Complications: No apparent anesthesia complications

## 2018-12-11 NOTE — Progress Notes (Signed)
No bleeding or hematoma noted after ambuklation

## 2018-12-11 NOTE — CV Procedure (Signed)
INDICATIONS: atrial fibrillation  PROCEDURE:   Informed consent was obtained prior to the procedure. The risks, benefits and alternatives for the procedure were discussed and the patient comprehended these risks.  Risks include, but are not limited to, cough, sore throat, vomiting, nausea, somnolence, esophageal and stomach trauma or perforation, bleeding, low blood pressure, aspiration, pneumonia, infection, trauma to the teeth and death.    After a procedural time-out, the oropharynx was anesthetized with 20% benzocaine spray.   During this procedure the patient was administered a total of Versed 5 mg and Fentanyl 125 mg to achieve and maintain moderate conscious sedation.  The patient's heart rate, blood pressure, and oxygen saturationweare monitored continuously during the procedure. The period of conscious sedation was 45 minutes, of which I was present face-to-face 100% of this time.  The transesophageal probe was inserted in the esophagus and stomach without difficulty and multiple views were obtained.  The patient was kept under observation until the patient left the procedure room.  The patient left the procedure room in stable condition.   Agitated microbubble saline contrast was not administered.  COMPLICATIONS:    There were no immediate complications.  FINDINGS:  No left atrial appendage thrombus seen. Definity contrast administered.  RECOMMENDATIONS:    Can proceed to EP lab if indicated.   Time Spent Directly with the Patient:  60 minutes   Elouise Munroe 12/11/2018, 9:24 AM

## 2018-12-12 ENCOUNTER — Encounter (HOSPITAL_COMMUNITY): Payer: Self-pay | Admitting: Internal Medicine

## 2018-12-12 LAB — POCT ACTIVATED CLOTTING TIME
Activated Clotting Time: 318 seconds
Activated Clotting Time: 329 seconds

## 2018-12-13 ENCOUNTER — Telehealth: Payer: Self-pay | Admitting: Internal Medicine

## 2018-12-13 NOTE — Telephone Encounter (Signed)
Patient is calling wanting to know how long it normally takes for your throat to return back to normal after the procedures he had preformed on 12/11/18 because he is still having trouble in regards to it. Please Advise.

## 2018-12-13 NOTE — Discharge Instructions (Signed)
TEE  YOU HAD AN CARDIAC PROCEDURE TODAY: Refer to the procedure report and other information in the discharge instructions given to you for any specific questions about what was found during the examination. If this information does not answer your questions, please call [facility] office at [phone] to clarify.   DIET: Your first meal following the procedure should be a light meal and then it is ok to progress to your normal diet. A half-sandwich or bowl of soup is an example of a good first meal. Heavy or fried foods are harder to digest and may make you feel nauseous or bloated. Drink plenty of fluids but you should avoid alcoholic beverages for 24 hours. If you had a esophageal dilation, please see attached instructions for diet.   ACTIVITY: Your care partner should take you home directly after the procedure. You should plan to take it easy, moving slowly for the rest of the day. You can resume normal activity the day after the procedure however YOU SHOULD NOT DRIVE, use power tools, machinery or perform tasks that involve climbing or major physical exertion for 24 hours (because of the sedation medicines used during the test).   SYMPTOMS TO REPORT IMMEDIATELY: A cardiologist can be reached at any hour. Please call [phone] for any of the following symptoms:  Vomiting of blood or coffee ground material  New, significant abdominal pain  New, significant chest pain or pain under the shoulder blades  Painful or persistently difficult swallowing  New shortness of breath  Black, tarry-looking or red, bloody stools  FOLLOW UP:  Please also call with any specific questions about appointments or follow up tests.  TEE  YOU HAD AN CARDIAC PROCEDURE TODAY: Refer to the procedure report and other information in the discharge instructions given to you for any specific questions about what was found during the examination. If this information does not answer your questions, please call [facility] office at [phone] to clarify.   DIET: Your first meal following the procedure should be a light meal and then it is ok to progress to your normal diet. A half-sandwich or bowl of soup is an example of a good first meal. Heavy or fried foods are harder to digest and may make you feel nauseous or bloated. Drink plenty of fluids but you should avoid alcoholic beverages for 24 hours. If you had a esophageal dilation, please see attached instructions for diet.   ACTIVITY: Your care partner should take you home directly after the procedure. You should plan to take it easy, moving slowly for the rest of the day. You can resume normal activity the day after the procedure however YOU SHOULD NOT DRIVE, use power tools, machinery or perform tasks that involve climbing or major physical exertion for 24 hours (because of the sedation medicines used during the test).   SYMPTOMS TO REPORT IMMEDIATELY: A cardiologist can be reached at any hour. Please call [phone] for any of the following symptoms:  Vomiting of blood or coffee ground material  New, significant abdominal pain  New, significant chest pain or pain under the shoulder blades  Painful or persistently difficult swallowing  New shortness of breath  Black, tarry-looking or red, bloody stools  FOLLOW UP:  Please also call with any specific questions about appointments or follow up tests.  Cardiac Ablation, Care After This sheet gives you information about how to care for yourself after your procedure. Your health care provider may also give you more specific instructions. If you  have problems or questions, contact your health care provider. What can I expect after the procedure? After the procedure, it is common to have:  Bruising around your puncture site.  Tenderness around your puncture site.  Skipped heartbeats.  Tiredness (fatigue).  Follow these instructions at home: Puncture site care   Follow instructions from  your health care provider about how to take care of your puncture site. Make sure you: ? If present, leave stitches (sutures), skin glue, or adhesive strips in place. These skin closures may need to stay in place for up to 2 weeks. If adhesive strip edges start to loosen and curl up, you may trim the loose edges. Do not remove adhesive strips completely unless your health care provider tells you to do that.  Check your puncture site every day for signs of infection. Check for: ? Redness, swelling, or pain. ? Fluid or blood. If your puncture site starts to bleed, lie down on your back, apply firm pressure to the area, and contact your health care provider. ? Warmth. ? Pus or a bad smell. Driving  Do not drive for at least 4 days after your procedure or however long your health care provider recommends.  Do not drive or use heavy machinery while taking prescription pain medicine.  Do not drive for 24 hours if you were given a medicine to help you relax (sedative) during your procedure. Activity  Avoid activities that take a lot of effort for at least 7 days after your procedure.  Do not lift anything that is heavier than 5 lb (4.5 kg) for one week.   No sexual activity for 1 week.   Return to your normal activities as told by your health care provider. Ask your health care provider what activities are safe for you. General instructions  Take over-the-counter and prescription medicines only as told by your health care provider.  Do not use any products that contain nicotine or tobacco, such as cigarettes and e-cigarettes. If you need help quitting, ask your health care provider.  You may shower after 24 hours, but Do not take baths, swim, or use a hot tub for 1 week.   Do not drink alcohol for 24 hours after your procedure.  Keep all follow-up visits as told by your health care provider. This is important. Contact a health care provider if:  You have redness, mild swelling, or pain  around your puncture site.  You have fluid or blood coming from your puncture site that stops after applying firm pressure to the area.  Your puncture site feels warm to the touch.  You have pus or a bad smell coming from your puncture site.  You have a fever.  You have chest pain or discomfort that spreads to your neck, jaw, or arm.  You are sweating a lot.  You feel nauseous.  You have a fast or irregular heartbeat.  You have shortness of breath.  You are dizzy or light-headed and feel the need to lie down.  You have pain or numbness in the arm or leg closest to your puncture site. Get help right away if:  Your puncture site suddenly swells.  Your puncture site is bleeding and the bleeding does not stop after applying firm pressure to the area. These symptoms may represent a serious problem that is an emergency. Do not wait to see if the symptoms will go away. Get medical help right away. Call your local emergency services (911 in the [place] Do not  drive yourself to the hospital. Summary  After the procedure, it is normal to have bruising and tenderness at the puncture site in your groin, neck, or forearm.  Check your puncture site every day for signs of infection.  Get help right away if your puncture site is bleeding and the bleeding does not stop after applying firm pressure to the area. This is a medical emergency. This information is not intended to replace advice given to you by your health care provider. Make sure you discuss any questions you have with your health care provider.

## 2018-12-13 NOTE — Telephone Encounter (Signed)
Patient had TEE and cardioversion. Patient swallowed 2 pills at once and felt like one did not go down. He drank hot chocolate to "melt" the pill.I advise he swallow one pill at a time and drink room temperature liquids vs hot and cold drinks.He will call back for any changes

## 2018-12-17 ENCOUNTER — Other Ambulatory Visit (HOSPITAL_COMMUNITY): Payer: Medicare HMO

## 2018-12-17 ENCOUNTER — Other Ambulatory Visit (HOSPITAL_COMMUNITY)
Admission: RE | Admit: 2018-12-17 | Discharge: 2018-12-17 | Disposition: A | Discharge status: Home / Self Care | Payer: Medicare HMO | Source: Ambulatory Visit | Attending: Cardiovascular Disease | Admitting: Cardiovascular Disease

## 2018-12-17 DIAGNOSIS — Z20828 Contact with and (suspected) exposure to other viral communicable diseases: Secondary | ICD-10-CM | POA: Diagnosis not present

## 2018-12-17 DIAGNOSIS — Z01812 Encounter for preprocedural laboratory examination: Secondary | ICD-10-CM | POA: Insufficient documentation

## 2018-12-18 LAB — NOVEL CORONAVIRUS, NAA (HOSP ORDER, SEND-OUT TO REF LAB; TAT 18-24 HRS): SARS-CoV-2, NAA: NOT DETECTED

## 2018-12-20 ENCOUNTER — Other Ambulatory Visit: Payer: Self-pay

## 2018-12-20 ENCOUNTER — Other Ambulatory Visit: Payer: Self-pay | Admitting: Internal Medicine

## 2018-12-20 ENCOUNTER — Ambulatory Visit (HOSPITAL_BASED_OUTPATIENT_CLINIC_OR_DEPARTMENT_OTHER): Payer: Medicare HMO | Attending: Cardiology | Admitting: Cardiovascular Disease

## 2018-12-20 DIAGNOSIS — Z79899 Other long term (current) drug therapy: Secondary | ICD-10-CM | POA: Insufficient documentation

## 2018-12-20 DIAGNOSIS — R0683 Snoring: Secondary | ICD-10-CM | POA: Diagnosis not present

## 2018-12-20 DIAGNOSIS — R0902 Hypoxemia: Secondary | ICD-10-CM | POA: Insufficient documentation

## 2018-12-20 DIAGNOSIS — I10 Essential (primary) hypertension: Secondary | ICD-10-CM | POA: Diagnosis not present

## 2018-12-20 DIAGNOSIS — Z7901 Long term (current) use of anticoagulants: Secondary | ICD-10-CM | POA: Insufficient documentation

## 2018-12-20 DIAGNOSIS — G4730 Sleep apnea, unspecified: Secondary | ICD-10-CM | POA: Diagnosis not present

## 2018-12-21 ENCOUNTER — Other Ambulatory Visit (HOSPITAL_BASED_OUTPATIENT_CLINIC_OR_DEPARTMENT_OTHER): Payer: Self-pay

## 2018-12-21 DIAGNOSIS — R0683 Snoring: Secondary | ICD-10-CM

## 2018-12-26 ENCOUNTER — Encounter (HOSPITAL_BASED_OUTPATIENT_CLINIC_OR_DEPARTMENT_OTHER): Payer: Self-pay | Admitting: Cardiovascular Disease

## 2018-12-26 NOTE — Procedures (Signed)
Patient Name: Kent James, Kent James Date: 12/20/2018 Gender: Male D.O.B: 28-Dec-1945 Age (years): 48 Referring Provider: Shelva Majestic MD, ABSM Height (inches): 70 Interpreting Physician: Shelva Majestic MD, ABSM Weight (lbs): 255 RPSGT: Jorge Ny BMI: 37 MRN: [id number] Neck Size: 16.50  CLINICAL INFORMATION Sleep Study Type: NPSG  Indication for sleep study: Hypertension, Snorin  Epworth Sleepiness Score: 4  SLEEP STUDY TECHNIQUE As per the AASM Manual for the Scoring of Sleep and Associated Events v2.3 (April 2016) with a hypopnea requiring 4% desaturations.  The channels recorded and monitored were frontal, central and occipital EEG, electrooculogram (EOG), submentalis EMG (chin), nasal and oral airflow, thoracic and abdominal wall motion, anterior tibialis EMG, snore microphone, electrocardiogram, and pulse oximetry.  MEDICATIONS apixaban (ELIQUIS) 5 MG TABS tablet  diltiazem (CARDIZEM) 30 MG tablet  lisinopril (ZESTRIL) 10 MG tablet  omeprazole (PRILOSEC) 20 MG capsule  pantoprazole (PROTONIX) 40 MG tablet  sotalol (BETAPACE) 120 MG tablet  Medications self-administered by patient taken the night of the study : ELIQUIS, SOTOLOL  SLEEP ARCHITECTURE The study was initiated at 10:25:42 PM and ended at 4:47:07 AM.  Sleep onset time was 11.3 minutes and the sleep efficiency was 28.3%%. The total sleep time was 108 minutes.  Stage REM latency was 89.0 minutes.  The patient spent 6.0%% of the night in stage N1 sleep, 79.2%% in stage N2 sleep, 0.0%% in stage N3 and 14.8% in REM.  Alpha intrusion was absent.  Supine sleep was 0.00%.  RESPIRATORY PARAMETERS The overall apnea/hypopnea index (AHI) was 3.9 per hour. The respiratory disturbance index (RDI) was 3.9/h.There were 0 total apneas, including 0 obstructive, 0 central and 0 mixed apneas. There were 7 hypopneas and 0 RERAs.  The AHI during Stage REM sleep was 22.5 per hour.  AHI while supine was  N/A per hour.  The mean oxygen saturation was 92.9%. The minimum SpO2 during sleep was 89.0%.  Soft snoring was noted during this study.  CARDIAC DATA The 2 lead EKG demonstrated sinus rhythm. The mean heart rate was 52.3 beats per minute. Other EKG findings include: None.  LEG MOVEMENT DATA The total PLMS were 0 with a resulting PLMS index of 0.0. Associated arousal with leg movement index was 2.2 .  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 - The patient had minimal oxygen desaturation to a nadir of 89.0%. - The patient snored with soft snoring volume. - Very poor sleep efficiency at only 28.3% with wake after sleep onset (WASO) at 262 minutes. - No cardiac abnormalities were noted during this study. - Clinically significant periodic limb movements did not occur during sleep. No significant associated arousals.  DIAGNOSIS - Sleep apnea, unspecified G47.30 - Nocturnal Hypoxemia (327.26 [G47.36 ICD-10])  RECOMMENDATIONS - At present patient does not meet criteria for CPAP therapy, but consider alternatives to treatment with moderate sleep apnea during REM sleep. - Effort should be made to optimize nasal and oropharyngeal patency. - If patient continues to be symptomatic consider a follow-up evaluation with a sleep aid to improve sleep efficiency. - Avoid alcohol, sedatives and other CNS depressants that may worsen sleep apnea and disrupt normal sleep architecture. - Sleep hygiene should be reviewed to assess factors that may improve sleep quality. - Weight management (BMI 37) and regular exercise should be initiated or continued if appropriate.  [Electronically  signed] 12/26/2018 07:53 AM  Shelva Majestic MD, [facility], [place], American Board of Sleep Medicine   NPI:  [id number] [place] PH: [phone]   FX: [phone] [place]

## 2018-12-27 ENCOUNTER — Telehealth: Payer: Self-pay | Admitting: *Deleted

## 2018-12-27 NOTE — Telephone Encounter (Signed)
Patient notified of sleep study results and recommendations. 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 Patient states that after the sleep study he went home took a shower and slept for 4 hours with no problems.

## 2019-01-08 ENCOUNTER — Ambulatory Visit (HOSPITAL_COMMUNITY)
Admission: RE | Admit: 2019-01-08 | Discharge: 2019-01-08 | Disposition: A | Discharge status: Home / Self Care | Payer: Medicare HMO | Source: Ambulatory Visit | Attending: Physician Assistant | Admitting: Physician Assistant

## 2019-01-08 ENCOUNTER — Other Ambulatory Visit: Payer: Self-pay

## 2019-01-08 VITALS — BP 150/76 | HR 56 | Ht 70.0 in | Wt 247.4 lb

## 2019-01-08 DIAGNOSIS — Z8249 Family history of ischemic heart disease and other diseases of the circulatory system: Secondary | ICD-10-CM | POA: Diagnosis not present

## 2019-01-08 DIAGNOSIS — I4891 Unspecified atrial fibrillation: Secondary | ICD-10-CM | POA: Diagnosis present

## 2019-01-08 DIAGNOSIS — Z7901 Long term (current) use of anticoagulants: Secondary | ICD-10-CM | POA: Insufficient documentation

## 2019-01-08 DIAGNOSIS — K219 Gastro-esophageal reflux disease without esophagitis: Secondary | ICD-10-CM | POA: Insufficient documentation

## 2019-01-08 DIAGNOSIS — I10 Essential (primary) hypertension: Secondary | ICD-10-CM | POA: Diagnosis not present

## 2019-01-08 DIAGNOSIS — I480 Paroxysmal atrial fibrillation: Secondary | ICD-10-CM | POA: Insufficient documentation

## 2019-01-08 DIAGNOSIS — Z87891 Personal history of nicotine dependence: Secondary | ICD-10-CM | POA: Diagnosis not present

## 2019-01-08 DIAGNOSIS — D6869 Other thrombophilia: Secondary | ICD-10-CM | POA: Diagnosis not present

## 2019-01-08 DIAGNOSIS — E669 Obesity, unspecified: Secondary | ICD-10-CM | POA: Diagnosis not present

## 2019-01-08 DIAGNOSIS — Z79899 Other long term (current) drug therapy: Secondary | ICD-10-CM | POA: Insufficient documentation

## 2019-01-08 DIAGNOSIS — Z803 Family history of malignant neoplasm of breast: Secondary | ICD-10-CM | POA: Diagnosis not present

## 2019-01-08 DIAGNOSIS — Z6835 Body mass index (BMI) 35.0-35.9, adult: Secondary | ICD-10-CM | POA: Insufficient documentation

## 2019-01-08 LAB — BASIC METABOLIC PANEL
Anion gap: 8 (ref 5–15)
BUN: 7 mg/dL — ABNORMAL LOW (ref 8–23)
CO2: 27 mmol/L (ref 22–32)
Calcium: 9.1 mg/dL (ref 8.9–10.3)
Chloride: 105 mmol/L (ref 98–111)
Creatinine, Ser: 0.93 mg/dL (ref 0.61–1.24)
GFR calc Af Amer: >60 mL/min (ref >60)
GFR calc non Af Amer: >60 mL/min (ref >60)
Glucose, Bld: 104 mg/dL — ABNORMAL HIGH (ref 70–99)
Potassium: 4.3 mmol/L (ref 3.5–5.1)
Sodium: 140 mmol/L (ref 135–145)

## 2019-01-08 LAB — MAGNESIUM: Magnesium: 2 mg/dL (ref 1.7–2.4)

## 2019-01-08 NOTE — Progress Notes (Signed)
Primary Care Physician: Kent Bruk Tumolo, NP Primary Cardiologist: Dr Gardiner Rhyme Primary Electrophysiologist: Dr Rayann Heman Referring Physician: Dr Lorrene Reid Kent James is a 74 y.o. male with a history of paroxysmal atrial fibrillation and HTN who presents for follow up in the [facility].  The patient was initially diagnosed with atrial fibrillation in 2018 after presenting with symptoms of shortness of breath and an irregular pulse. He underwent two DCCV in 2018 and was started on sotalol. He had done well until 10/23/18 when he again had afib symptoms with heart rates in the 170s. He presented to the ER but converted to SR before being seen. He was seen by Dr Gardiner Rhyme and started on anticoagulation. He is on Eliquis for a CHADS2VASC score of 2. Patient does admit to snoring and witnessed apnea and a sleep study has been ordered. Patient reports that 11/05/18 he woke up in the middle of the night and felt he was back out of rhythm. He does have symptoms of fatigue with exertion and palpitations.  On follow up today, patient is s/p afib ablation on 12/11/18 with Dr Rayann Heman. Patient reports that he has had 3 episodes of afib since his ablation. Two were brief but one lasted about 4 days. He is in SR today. He denies any CP, swallowing, or groin issues.  Today, he denies symptoms of palpitations, chest pain, shortness of breath, orthopnea, PND, lower extremity edema, dizziness, presyncope, syncope, bleeding, or neurologic sequela. The patient is tolerating medications without difficulties and is otherwise without complaint today.    Atrial Fibrillation Risk Factors:  he does not have symptoms or diagnosis of sleep apnea. he does not have a history of rheumatic fever. he does not have a history of alcohol use. The patient does not have a history of early familial atrial fibrillation or other arrhythmias.  he has a BMI of Body mass index is 35.5 kg/m.Marland Kitchen Filed Weights   01/08/19 1153  Weight: 112.2 kg    Family History  Problem Relation Age of Onset  . Heart attack Father   . Breast cancer Sister   . Arthritis Maternal Grandmother      Atrial Fibrillation Management history:  Previous antiarrhythmic drugs: sotalol Previous cardioversions: 2018 x2 Previous ablations: 12/11/18 CHADS2VASC score: 2 Anticoagulation history: Eliquis (started 11/02/18)   Past Medical History:  Diagnosis Date  . Childhood asthma   . Essential hypertension   . GERD (gastroesophageal reflux disease)   . History of chicken pox   . History of colon polyps   . Hyperlipidemia   . Persistent atrial fibrillation ([place])   . Snoring    Past Surgical History:  Procedure Laterality Date  . ATRIAL FIBRILLATION ABLATION N/A 12/11/2018   Procedure: ATRIAL FIBRILLATION ABLATION;  Surgeon: Thompson Grayer, MD;  Location: [facility];  Service: Cardiovascular;  Laterality: N/A;  . CARDIOVERSION    . TEE WITHOUT CARDIOVERSION N/A 12/11/2018   Procedure: TRANSESOPHAGEAL ECHOCARDIOGRAM (TEE);  Surgeon: Elouise Munroe, MD;  Location: [facility];  Service: Cardiology;  Laterality: N/A;    Current Outpatient Medications  Medication Sig Dispense Refill  . apixaban (ELIQUIS) 5 MG TABS tablet Take 1 tablet (5 mg total) by mouth 2 (two) times daily. 180 tablet 3  . diltiazem (CARDIZEM) 30 MG tablet TAKE 1 TABLET EVERY 4 HOURS AS NEEDED FOR HR <100 (Patient taking differently: Take 30 mg by mouth every 4 (four) hours as needed (for HR greater than 100). ) 45 tablet 1  .  lisinopril (ZESTRIL) 10 MG tablet Take 1 tablet (10 mg total) by mouth daily. 90 tablet 3  . omeprazole (PRILOSEC) 20 MG capsule Take 20 mg by mouth daily.    . sotalol (BETAPACE) 120 MG tablet TAKE 1 TABLET (120 MG TOTAL) BY MOUTH 2 (TWO) TIMES DAILY. 180 tablet 0   No current facility-administered medications for this encounter.    No Known Allergies  Social History   Socioeconomic History  . Marital  status: Married    Spouse name: Not on file  . Number of children: Not on file  . Years of education: Not on file  . Highest education level: Not on file  Occupational History  . Not on file  Tobacco Use  . Smoking status: Former Smoker    Start date: 03/14/2017  . Smokeless tobacco: Never Used  Substance and Sexual Activity  . Alcohol use: Yes    Comment: daily liquor previously,  no alcohol in the past month  . Drug use: No  . Sexual activity: Not on file  Other Topics Concern  . Not on file  Social History Narrative   Lives in [place] with spouse.   Retired Hotel manager   Social Determinants of Radio broadcast assistant Strain:   . Difficulty of Paying Living Expenses: Not on file  Food Insecurity:   . Worried About Charity fundraiser in the Last Year: Not on file  . Ran Out of Food in the Last Year: Not on file  Transportation Needs:   . Lack of Transportation (Medical): Not on file  . Lack of Transportation (Non-Medical): Not on file  Physical Activity:   . Days of Exercise per Week: Not on file  . Minutes of Exercise per Session: Not on file  Stress:   . Feeling of Stress : Not on file  Social Connections:   . Frequency of Communication with Friends and Family: Not on file  . Frequency of Social Gatherings with Friends and Family: Not on file  . Attends Religious Services: Not on file  . Active Member of Clubs or Organizations: Not on file  . Attends Archivist Meetings: Not on file  . Marital Status: Not on file  Intimate Partner Violence:   . Fear of Current or Ex-Partner: Not on file  . Emotionally Abused: Not on file  . Physically Abused: Not on file  . Sexually Abused: Not on file     ROS- All systems are reviewed and negative except as per the HPI above.  Physical Exam: Vitals:   01/08/19 1153  BP: (!) 150/76  Pulse: (!) 56  Weight: 112.2 kg  Height: 5\' 10"  (1.778 m)   GEN- The patient is well appearing obese male, alert and  oriented x 3 today.   HEENT-head normocephalic, atraumatic, sclera clear, conjunctiva pink, hearing intact, trachea midline. Lungs- Clear to ausculation bilaterally, normal work of breathing Heart- Regular rate and rhythm, no murmurs, rubs or gallops  GI- soft, NT, ND, + BS Extremities- no clubbing, cyanosis, or edema MS- no significant deformity or atrophy Skin- no rash or lesion Psych- euthymic mood, full affect Neuro- strength and sensation are intact   Wt Readings from Last 3 Encounters:  01/08/19 112.2 kg  12/20/18 115.7 kg  12/11/18 115.7 kg    EKG today demonstrates SB HR 56, PR 194, QRS 90, QTc 438   Echo 11/12/18 1. Left ventricular ejection fraction, by visual estimation, is 65 to 70%. The left ventricle has hyperdynamic function.  There is moderately increased left ventricular hypertrophy.  2. Global right ventricle has normal systolic function.The right ventricular size is normal. No increase in right ventricular wall thickness.  3. Left atrial size was normal.  4. Right atrial size was normal.  5. The mitral valve is normal in structure. No evidence of mitral valve regurgitation.  6. The tricuspid valve is normal in structure. Tricuspid valve regurgitation is trivial.  7. The aortic valve is tricuspid. Aortic valve regurgitation is mild. Mild aortic valve sclerosis without stenosis.  8. The pulmonic valve was grossly normal. Pulmonic valve regurgitation is not visualized.   Epic records are reviewed at length today  Assessment and Plan:  1. Paroxysmal atrial fibrillation S/p afib ablation 12/11/18. Reassured patient that some afib is normal for the first 3 months post ablation. Overall, he is feeling well since the ablation. Encouraged patient to reach out to AF clinic if he should have persistent afib. Continue sotalol 120 mg BID. QT stable. Bmet/mag today. Continue Eliquis 5 mg BID Continue diltiazem 30 mg PRN q4hrs for heart rate >100bpm.  This patients  CHA2DS2-VASc Score and unadjusted Ischemic Stroke Rate (% per year) is equal to 2.2 % stroke rate/year from a score of 2  Above score calculated as 1 point each if present [CHF, HTN, DM, Vascular=MI/PAD/Aortic Plaque, Age if 65-74, or Male] Above score calculated as 2 points each if present [Age > 75, or Stroke/TIA/TE]   2. Obesity Body mass index is 35.5 kg/m. Lifestyle modification was discussed and encouraged including regular physical activity and weight reduction.  3. HTN Improved since last visit, no changes today.   Follow up with Dr Rayann Heman as scheduled.    [facility] [address] [place], [place] [address] [phone] 01/08/2019 1:10 PM

## 2019-01-17 ENCOUNTER — Other Ambulatory Visit: Payer: Self-pay | Admitting: Internal Medicine

## 2019-02-11 ENCOUNTER — Ambulatory Visit: Payer: Medicare HMO | Attending: Internal Medicine

## 2019-02-11 DIAGNOSIS — Z23 Encounter for immunization: Secondary | ICD-10-CM | POA: Diagnosis not present

## 2019-02-11 NOTE — Progress Notes (Signed)
   Covid-19 Vaccination Clinic  Name:  Kent James    MRN: [id number] DOB: Mar 02, 1945  02/11/2019  [name] was observed post Covid-19 immunization for 15 minutes without incidence. He was provided with Vaccine Information Sheet and instruction to access the V-Safe system.   [name] was instructed to call 911 with any severe reactions post vaccine: Marland Kitchen Difficulty breathing  . Swelling of your face and throat  . A fast heartbeat  . A bad rash all over your body  . Dizziness and weakness    Immunizations Administered    Name Date Dose VIS Date Route   Pfizer COVID-19 Vaccine 02/11/2019  3:14 PM 0.3 mL 12/14/2018 Intramuscular   Manufacturer: [place]   Lot: [id number]   [place]: [id number]

## 2019-03-07 ENCOUNTER — Telehealth: Payer: Self-pay

## 2019-03-08 ENCOUNTER — Ambulatory Visit: Payer: Medicare HMO | Attending: Internal Medicine

## 2019-03-08 DIAGNOSIS — Z23 Encounter for immunization: Secondary | ICD-10-CM

## 2019-03-08 NOTE — Progress Notes (Signed)
   Covid-19 Vaccination Clinic  Name:  Kent James    MRN: [id number] DOB: Nov 02, 1945  03/08/2019  [name] was observed post Covid-19 immunization for 15 minutes without incident. He was provided with Vaccine Information Sheet and instruction to access the V-Safe system.   [name] was instructed to call 911 with any severe reactions post vaccine: Marland Kitchen Difficulty breathing  . Swelling of face and throat  . A fast heartbeat  . A bad rash all over body  . Dizziness and weakness   Immunizations Administered    Name Date Dose VIS Date Route   Pfizer COVID-19 Vaccine 03/08/2019 11:10 AM 0.3 mL 12/14/2018 Intramuscular   Manufacturer: [place]   Lot: [id number]   [place]: [id number]

## 2019-03-11 ENCOUNTER — Encounter: Payer: Self-pay | Admitting: Internal Medicine

## 2019-03-11 ENCOUNTER — Telehealth (INDEPENDENT_AMBULATORY_CARE_PROVIDER_SITE_OTHER): Payer: Medicare HMO | Admitting: Internal Medicine

## 2019-03-11 VITALS — BP 144/94 | HR 67 | Ht 70.0 in | Wt 247.0 lb

## 2019-03-11 DIAGNOSIS — D6869 Other thrombophilia: Secondary | ICD-10-CM | POA: Diagnosis not present

## 2019-03-11 DIAGNOSIS — I480 Paroxysmal atrial fibrillation: Secondary | ICD-10-CM | POA: Diagnosis not present

## 2019-03-11 DIAGNOSIS — I10 Essential (primary) hypertension: Secondary | ICD-10-CM | POA: Diagnosis not present

## 2019-03-11 NOTE — Progress Notes (Signed)
Electrophysiology TeleHealth Note   Due to national recommendations of social distancing due to COVID 19, an audio/video telehealth visit is felt to be most appropriate for this patient at this time.  See MyChart message from today for the patient's consent to telehealth for [facility].  His video will not work today.  Date:  03/11/2019   ID:  Kent James, DOB 10-06-1945, MRN [id number]  Location: patient's home  Provider location:  [facility]  Evaluation Performed: Follow-up visit  PCP:  Jearld Fenton, NP   Electrophysiologist:  Dr Rayann Heman  Chief Complaint:  palpitations  History of Present Illness:    Kent James is a 74 y.o. male who presents via telehealth conferencing today.  Since last being seen in our clinic, the patient reports doing very well.  He denies procedure related complications.  He has had brief ERAF early post ablation but none recently.  Today, he denies symptoms of palpitations, chest pain, shortness of breath,  lower extremity edema, dizziness, presyncope, or syncope.  The patient is otherwise without complaint today.  The patient denies symptoms of fevers, chills, cough, or new SOB worrisome for COVID 19.  Past Medical History:  Diagnosis Date  . Childhood asthma   . Essential hypertension   . GERD (gastroesophageal reflux disease)   . History of chicken pox   . History of colon polyps   . Hyperlipidemia   . Persistent atrial fibrillation ([place])   . Snoring     Past Surgical History:  Procedure Laterality Date  . ATRIAL FIBRILLATION ABLATION N/A 12/11/2018   Procedure: ATRIAL FIBRILLATION ABLATION;  Surgeon: Thompson Grayer, MD;  Location: [facility];  Service: Cardiovascular;  Laterality: N/A;  . CARDIOVERSION    . TEE WITHOUT CARDIOVERSION N/A 12/11/2018   Procedure: TRANSESOPHAGEAL ECHOCARDIOGRAM (TEE);  Surgeon: Elouise Munroe, MD;  Location: [facility];  Service: Cardiology;  Laterality: N/A;    Current Outpatient  Medications  Medication Sig Dispense Refill  . apixaban (ELIQUIS) 5 MG TABS tablet Take 1 tablet (5 mg total) by mouth 2 (two) times daily. 180 tablet 3  . omeprazole (PRILOSEC) 20 MG capsule Take 20 mg by mouth daily.    . sotalol (BETAPACE) 120 MG tablet TAKE 1 TABLET (120 MG TOTAL) BY MOUTH 2 (TWO) TIMES DAILY. 180 tablet 0  . lisinopril (ZESTRIL) 10 MG tablet Take 1 tablet (10 mg total) by mouth daily. 90 tablet 3   No current facility-administered medications for this visit.    Allergies:   Patient has no known allergies.   Social History:  The patient  reports that he has quit smoking. He started smoking about 1 years ago. He has never used smokeless tobacco. He reports current alcohol use. He reports that he does not use drugs.   ROS:  Please see the history of present illness.   All other systems are personally reviewed and negative.    Exam:    Vital Signs:  BP (!) 144/94   Pulse 67   Ht 5\' 10"  (1.778 m)   Wt 247 lb (112 kg)   BMI 35.44 kg/m   Well sounding, alert and conversant   Labs/Other Tests and Data Reviewed:    Recent Labs: 11/09/2018: TSH 1.500 11/23/2018: Hemoglobin 17.1; Platelets 188 01/08/2019: BUN 7; Creatinine, Ser 0.93; Magnesium 2.0; Potassium 4.3; Sodium 140   Wt Readings from Last 3 Encounters:  03/11/19 247 lb (112 kg)  01/08/19 247 lb 6.4 oz (112.2 kg)  12/20/18 255  lb (115.7 kg)     ASSESSMENT & PLAN:    1.  Persistent atrial fibrillation Doing very well post ablation We discussed stopping sotalol at length today.  He is not ready to stop it but may try reducing to 60mg  BID.  IF he has no afib in 4-6 weeks then he may stop the medicine chads2vasc score is 2.  He is on eliquis We discussed arrest af study at length which highlights the importance of lifestyle modifiaiton  2. Obesity Lifestyle modification is encouraged  3. Snoring Sleep study did not reveal sleep apnea  4. HTN Stable No change required today  Follow-up:  3  months   Patient Risk:  after full review of this patients clinical status, I feel that they are at moderate risk at this time.  Today, I have spent 15 minutes with the patient with telehealth technology discussing arrhythmia management .    Army Fossa, MD  03/11/2019 12:07 PM     [place] [place] [address] [place] [address] [phone] (office) [phone] (fax)

## 2019-03-19 ENCOUNTER — Encounter: Payer: Self-pay | Admitting: Internal Medicine

## 2019-03-19 ENCOUNTER — Other Ambulatory Visit: Payer: Self-pay

## 2019-03-19 ENCOUNTER — Ambulatory Visit (INDEPENDENT_AMBULATORY_CARE_PROVIDER_SITE_OTHER): Payer: Medicare HMO | Admitting: Internal Medicine

## 2019-03-19 VITALS — BP 136/88 | HR 72 | Temp 97.6°F | Ht 69.25 in | Wt 245.0 lb

## 2019-03-19 DIAGNOSIS — Z0000 Encounter for general adult medical examination without abnormal findings: Secondary | ICD-10-CM

## 2019-03-19 DIAGNOSIS — Z1211 Encounter for screening for malignant neoplasm of colon: Secondary | ICD-10-CM | POA: Diagnosis not present

## 2019-03-19 DIAGNOSIS — I10 Essential (primary) hypertension: Secondary | ICD-10-CM

## 2019-03-19 DIAGNOSIS — I480 Paroxysmal atrial fibrillation: Secondary | ICD-10-CM

## 2019-03-19 DIAGNOSIS — K219 Gastro-esophageal reflux disease without esophagitis: Secondary | ICD-10-CM | POA: Diagnosis not present

## 2019-03-19 DIAGNOSIS — E7800 Pure hypercholesterolemia, unspecified: Secondary | ICD-10-CM

## 2019-03-19 DIAGNOSIS — Z125 Encounter for screening for malignant neoplasm of prostate: Secondary | ICD-10-CM

## 2019-03-19 LAB — CBC
HCT: 47.9 % (ref 39.0–52.0)
Hemoglobin: 16.3 g/dL (ref 13.0–17.0)
MCHC: 34.1 g/dL (ref 30.0–36.0)
MCV: 88.2 fl (ref 78.0–100.0)
Platelets: 175 10*3/uL (ref 150.0–400.0)
RBC: 5.43 Mil/uL (ref 4.22–5.81)
RDW: 13.6 % (ref 11.5–15.5)
WBC: 6.5 10*3/uL (ref 4.0–10.5)

## 2019-03-19 LAB — COMPREHENSIVE METABOLIC PANEL
ALT: 16 U/L (ref 0–53)
AST: 18 U/L (ref 0–37)
Albumin: 4.1 g/dL (ref 3.5–5.2)
Alkaline Phosphatase: 77 U/L (ref 39–117)
BUN: 11 mg/dL (ref 6–23)
CO2: 29 mEq/L (ref 19–32)
Calcium: 9.1 mg/dL (ref 8.4–10.5)
Chloride: 102 mEq/L (ref 96–112)
Creatinine, Ser: 0.88 mg/dL (ref 0.40–1.50)
GFR: 84.76 mL/min (ref >60.00)
Glucose, Bld: 104 mg/dL — ABNORMAL HIGH (ref 70–99)
Potassium: 4.2 mEq/L (ref 3.5–5.1)
Sodium: 138 mEq/L (ref 135–145)
Total Bilirubin: 0.7 mg/dL (ref 0.2–1.2)
Total Protein: 6.7 g/dL (ref 6.0–8.3)

## 2019-03-19 LAB — PSA, MEDICARE: PSA: 5.03 ng/ml — ABNORMAL HIGH (ref 0.10–4.00)

## 2019-03-19 LAB — LIPID PANEL
Cholesterol: 177 mg/dL (ref 0–200)
HDL: 35.3 mg/dL — ABNORMAL LOW (ref >39.00)
LDL Cholesterol: 127 mg/dL — ABNORMAL HIGH (ref 0–99)
NonHDL: 141.42
Total CHOL/HDL Ratio: 5
Triglycerides: 72 mg/dL (ref 0.0–149.0)
VLDL: 14.4 mg/dL (ref 0.0–40.0)

## 2019-03-19 NOTE — Progress Notes (Signed)
HPI:  Pt presents to the clinic today for his subsequent annual Medicare Wellness Exam. He is also due to follow up chronic conditions.  HTN: His BP today is 136/88. He is taking Lisinopril and Sotalol as prescribed. ECG from 01/2019 reviewed.  GERD: Triggered by tomato based foods with alcohol. He denies breakthrough on Omeprazole. There is no upper GI on fle.  HLD: His last LDL was 127, 11/2017. He is not taking any cholesterol lowering medications at this time.  Afib: s/p ablation. Managed on Sotalol and Eliquis. He follows with cardiology. ECG from 01/2019  reviewed.  Past Medical History:  Diagnosis Date  . Childhood asthma   . Essential hypertension   . GERD (gastroesophageal reflux disease)   . History of chicken pox   . History of colon polyps   . Hyperlipidemia   . Persistent atrial fibrillation ([place])   . Snoring     Current Outpatient Medications  Medication Sig Dispense Refill  . apixaban (ELIQUIS) 5 MG TABS tablet Take 1 tablet (5 mg total) by mouth 2 (two) times daily. 180 tablet 3  . lisinopril (ZESTRIL) 10 MG tablet Take 1 tablet (10 mg total) by mouth daily. 90 tablet 3  . omeprazole (PRILOSEC) 20 MG capsule Take 20 mg by mouth daily.    . sotalol (BETAPACE) 120 MG tablet TAKE 1 TABLET (120 MG TOTAL) BY MOUTH 2 (TWO) TIMES DAILY. 180 tablet 0   No current facility-administered medications for this visit.    No Known Allergies  Family History  Problem Relation Age of Onset  . Heart attack Father   . Breast cancer Sister   . Arthritis Maternal Grandmother     Social History   Socioeconomic History  . Marital status: Married    Spouse name: Not on file  . Number of children: Not on file  . Years of education: Not on file  . Highest education level: Not on file  Occupational History  . Not on file  Tobacco Use  . Smoking status: Former Smoker    Start date: 03/14/2017  . Smokeless tobacco: Never Used  Substance and Sexual Activity  . Alcohol use: Yes     Comment: daily liquor previously,  no alcohol in the past month  . Drug use: No  . Sexual activity: Not on file  Other Topics Concern  . Not on file  Social History Narrative   Lives in [place] with spouse.   Retired Hotel manager   Social Determinants of Radio broadcast assistant Strain:   . Difficulty of Paying Living Expenses:   Food Insecurity:   . Worried About Charity fundraiser in the Last Year:   . Arboriculturist in the Last Year:   Transportation Needs:   . Film/video editor (Medical):   Marland Kitchen Lack of Transportation (Non-Medical):   Physical Activity:   . Days of Exercise per Week:   . Minutes of Exercise per Session:   Stress:   . Feeling of Stress :   Social Connections:   . Frequency of Communication with Friends and Family:   . Frequency of Social Gatherings with Friends and Family:   . Attends Religious Services:   . Active Member of Clubs or Organizations:   . Attends Archivist Meetings:   Marland Kitchen Marital Status:   Intimate Partner Violence:   . Fear of Current or Ex-Partner:   . Emotionally Abused:   Marland Kitchen Physically Abused:   . Sexually Abused:  Hospitiliaztions: 12/2018, TEE  Health Maintenance:    Flu: 09/2018  Tetanus:  unsure  Pneumovax: 03/2015  Prevnar: 02/2014  Covid: 02/2019, 03/2019  Zostavax: 2017  Shingrix: never  PSA: 12/2017  Colon Screening: 2016  Eye Doctor: annually  Dental Exam: biannually   Providers:   PCP: Webb Silversmith, NP-C  Cardiologist: [name]  Podiatry: Dr. Posey Pronto   I have personally reviewed and have noted:  1. The patient's medical and social history 2. Their use of alcohol, tobacco or illicit drugs 3. Their current medications and supplements 4. The patient's functional ability including ADL's, fall risks, home safety risks and hearing or visual impairment. 5. Diet and physical activities 6. Evidence for depression or mood disorder  Subjective:   Review of Systems:   Constitutional: Denies  fever, malaise, fatigue, headache or abrupt weight changes.  HEENT: Pt reports runny nose. Denies eye pain, eye redness, ear pain, ringing in the ears, wax buildup, nasal congestion, bloody nose, or sore throat. Respiratory: Denies difficulty breathing, shortness of breath, cough or sputum production.   Cardiovascular: Denies chest pain, chest tightness, palpitations or swelling in the hands or feet.  Gastrointestinal: Denies abdominal pain, bloating, constipation, diarrhea or blood in the stool.  GU: Denies urgency, frequency, pain with urination, burning sensation, blood in urine, odor or discharge. Musculoskeletal: Denies decrease in range of motion, difficulty with gait, muscle pain or joint pain and swelling.  Skin: Denies redness, rashes, lesions or ulcercations.  Neurological: Denies dizziness, difficulty with memory, difficulty with speech or problems with balance and coordination.  Psych: Denies anxiety, depression, SI/HI.  No other specific complaints in a complete review of systems (except as listed in HPI above).  Objective:  PE:   BP 136/88   Pulse 72   Temp 97.6 F (36.4 C) (Temporal)   Ht 5' 9.25" (1.759 m)   Wt 245 lb (111.1 kg)   SpO2 98%   BMI 35.92 kg/m   Wt Readings from Last 3 Encounters:  03/11/19 247 lb (112 kg)  01/08/19 247 lb 6.4 oz (112.2 kg)  12/20/18 255 lb (115.7 kg)    General: Appears his stated age, obese in NAD. Skin: Warm, dry and intact. No rashesnoted. HEENT: Head: normal shape and size; Eyes: sclera white, no icterus, conjunctiva pink, PERRLA and EOMs intact;  Neck: Neck supple, trachea midline. No masses, lumps or thyromegaly present.  Cardiovascular: Normal rate and rhythm. S1,S2 noted.  No murmur, rubs or gallops noted. No JVD or BLE edema. No carotid bruits noted. Pulmonary/Chest: Normal effort and positive vesicular breath sounds. No respiratory distress. No wheezes, rales or ronchi noted.  Abdomen: Soft and nontender. Normal bowel  sounds. Ventral hernia  noted. Liver, spleen and kidneys non palpable. Musculoskeletal: Strength 5/5 BUE/BLE. No difficulty with gait. Neurological: Alert and oriented. Cranial nerves II-XII grossly intact. Coordination normal.  Psychiatric: Mood and affect normal. Behavior is normal. Judgment and thought content normal.    BMET    Component Value Date/Time   NA 140 01/08/2019 1200   NA 139 11/23/2018 1050   K 4.3 01/08/2019 1200   CL 105 01/08/2019 1200   CO2 27 01/08/2019 1200   GLUCOSE 104 (H) 01/08/2019 1200   BUN 7 (L) 01/08/2019 1200   BUN 13 11/23/2018 1050   CREATININE 0.93 01/08/2019 1200   CALCIUM 9.1 01/08/2019 1200   GFRNONAA >60 01/08/2019 1200   GFRAA >60 01/08/2019 1200    Lipid Panel     Component Value Date/Time   CHOL  184 12/04/2017 1014   TRIG 88.0 12/04/2017 1014   HDL 39.30 12/04/2017 1014   CHOLHDL 5 12/04/2017 1014   VLDL 17.6 12/04/2017 1014   LDLCALC 127 (H) 12/04/2017 1014    CBC    Component Value Date/Time   WBC 7.0 11/23/2018 1050   WBC 7.1 10/31/2018 1602   RBC 5.53 11/23/2018 1050   RBC 5.63 10/31/2018 1602   HGB 17.1 11/23/2018 1050   HCT 48.1 11/23/2018 1050   PLT 188 11/23/2018 1050   MCV 87 11/23/2018 1050   MCH 30.9 11/23/2018 1050   MCH 30.0 10/31/2018 1602   MCHC 35.6 11/23/2018 1050   MCHC 33.9 10/31/2018 1602   RDW 13.1 11/23/2018 1050   LYMPHSABS 1.2 11/23/2018 1050   EOSABS 0.3 11/23/2018 1050   BASOSABS 0.1 11/23/2018 1050    Hgb A1C No results found for: HGBA1C    Assessment and Plan:   Medicare Annual Wellness Visit:  Diet: He does eat meat. He consumes more veggies than fruit. He tries to avoid fried foods. He drinks mostly seltzer water. Physical activity: Walking Depression/mood screen: Negative, PHQ score of 0 Hearing: Intact to whispered voice Visual acuity: Grossly normal, performs annual eye exam  ADLs: Capable Fall risk: None Home safety: Good Cognitive evaluation: Intact to orientation,  naming, recall and repetition EOL planning: No adv directives, full code/ I agree    Preventative Medicine:  Flu, pneumovax, prevnar, zostovax and covid vaccines UTD. Advised him if he gets bitten by an animal or cut, he should get a tetanus vaccine. Referral to GI for screening colonoscopy. Encouraged him to consume a balanced diet and exercise regimen. Advised him to see an eye doctor and dentist annually. Will check CBC, CMET, Lipid and PSA today.   Next appointment: 1 year, Medicare Wellness Exam   Webb Silversmith, NP

## 2019-03-19 NOTE — Assessment & Plan Note (Signed)
CMET and lipid profile today Encouraged him to consume a low fat diet 

## 2019-03-19 NOTE — Patient Instructions (Signed)
Health Maintenance After Age 74 After age 74, you are at a higher risk for certain long-term diseases and infections as well as injuries from falls. Falls are a major cause of broken bones and head injuries in people who are older than age 74. Getting regular preventive care can help to keep you healthy and well. Preventive care includes getting regular testing and making lifestyle changes as recommended by your health care provider. Talk with your health care provider about:  Which screenings and tests you should have. A screening is a test that checks for a disease when you have no symptoms.  A diet and exercise plan that is right for you. What should I know about screenings and tests to prevent falls? Screening and testing are the best ways to find a health problem early. Early diagnosis and treatment give you the best chance of managing medical conditions that are common after age 74. Certain conditions and lifestyle choices may make you more likely to have a fall. Your health care provider may recommend:  Regular vision checks. Poor vision and conditions such as cataracts can make you more likely to have a fall. If you wear glasses, make sure to get your prescription updated if your vision changes.  Medicine review. Work with your health care provider to regularly review all of the medicines you are taking, including over-the-counter medicines. Ask your health care provider about any side effects that may make you more likely to have a fall. Tell your health care provider if any medicines that you take make you feel dizzy or sleepy.  Osteoporosis screening. Osteoporosis is a condition that causes the bones to get weaker. This can make the bones weak and cause them to break more easily.  Blood pressure screening. Blood pressure changes and medicines to control blood pressure can make you feel dizzy.  Strength and balance checks. Your health care provider may recommend certain tests to check your  strength and balance while standing, walking, or changing positions.  Foot health exam. Foot pain and numbness, as well as not wearing proper footwear, can make you more likely to have a fall.  Depression screening. You may be more likely to have a fall if you have a fear of falling, feel emotionally low, or feel unable to do activities that you used to do.  Alcohol use screening. Using too much alcohol can affect your balance and may make you more likely to have a fall. What actions can I take to lower my risk of falls? General instructions  Talk with your health care provider about your risks for falling. Tell your health care provider if: ? You fall. Be sure to tell your health care provider about all falls, even ones that seem minor. ? You feel dizzy, sleepy, or off-balance.  Take over-the-counter and prescription medicines only as told by your health care provider. These include any supplements.  Eat a healthy diet and maintain a healthy weight. A healthy diet includes low-fat dairy products, low-fat (lean) meats, and fiber from whole grains, beans, and lots of fruits and vegetables. Home safety  Remove any tripping hazards, such as rugs, cords, and clutter.  Install safety equipment such as grab bars in bathrooms and safety rails on stairs.  Keep rooms and walkways well-lit. Activity   Follow a regular exercise program to stay fit. This will help you maintain your balance. Ask your health care provider what types of exercise are appropriate for you.  If you need a cane or   walker, use it as recommended by your health care provider.  Wear supportive shoes that have nonskid soles. Lifestyle  Do not drink alcohol if your health care provider tells you not to drink.  If you drink alcohol, limit how much you have: ? 0-1 drink a day for women. ? 0-2 drinks a day for men.  Be aware of how much alcohol is in your drink. In the [place] one drink equals one typical bottle of beer (12  oz), one-half glass of wine (5 oz), or one shot of hard liquor (1 oz).  Do not use any products that contain nicotine or tobacco, such as cigarettes and e-cigarettes. If you need help quitting, ask your health care provider. Summary  Having a healthy lifestyle and getting preventive care can help to protect your health and wellness after age 74.  Screening and testing are the best way to find a health problem early and help you avoid having a fall. Early diagnosis and treatment give you the best chance for managing medical conditions that are more common for people who are older than age 74.  Falls are a major cause of broken bones and head injuries in people who are older than age 74. Take precautions to prevent a fall at home.  Work with your health care provider to learn what changes you can make to improve your health and wellness and to prevent falls. This information is not intended to replace advice given to you by your health care provider. Make sure you discuss any questions you have with your health care provider. Document Revised: 04/12/2018 Document Reviewed: 11/02/2016 Elsevier Patient Education  2020 Elsevier Inc.  

## 2019-03-19 NOTE — Assessment & Plan Note (Signed)
Continue Omeprazole CBC and CMET today 

## 2019-03-19 NOTE — Assessment & Plan Note (Signed)
Continue Lisinopril and Sotalol CMET today

## 2019-03-19 NOTE — Assessment & Plan Note (Signed)
He plans to stop Sotalol in the next month He will continue Eliquis He will continue to follow with cardiology

## 2019-03-24 ENCOUNTER — Other Ambulatory Visit: Payer: Self-pay | Admitting: Internal Medicine

## 2019-03-25 NOTE — Telephone Encounter (Signed)
I saw there was a note of stopping medication... please advise if appropriate to refill

## 2019-03-25 NOTE — Telephone Encounter (Signed)
Yes please call him to see if he really needs this refill

## 2019-03-26 NOTE — Telephone Encounter (Signed)
Pt states he did not request for refill as he is still going to stop taking

## 2019-04-15 ENCOUNTER — Telehealth: Payer: Self-pay | Admitting: Gastroenterology

## 2019-04-15 NOTE — Telephone Encounter (Signed)
Dr. Fuller Plan doc of day for 3-17 pm.   Patient dropped off last colon and path report. Records placed on Dr. Lynne Leader desk for review.

## 2019-04-15 NOTE — Telephone Encounter (Signed)
Colonoscopy and pathology records received and reviewed. Four colon polyps removed at colonoscopy in 11/2014. Pathology: tubular adenomas and normal mucosa with lymphoid aggregates. Please schedule pre-colonoscopy office visit with me or APP as he is maintained on Eliquis.

## 2019-06-13 ENCOUNTER — Telehealth: Payer: Self-pay

## 2019-06-13 NOTE — Telephone Encounter (Signed)
Spoke with pt about his virtual visit on 06/17/19. Pt stated he did not have questions at this time. Pt agreed and confirmed virtual visit.

## 2019-06-17 ENCOUNTER — Telehealth (INDEPENDENT_AMBULATORY_CARE_PROVIDER_SITE_OTHER): Payer: Medicare HMO | Admitting: Internal Medicine

## 2019-06-17 ENCOUNTER — Encounter: Payer: Self-pay | Admitting: Internal Medicine

## 2019-06-17 VITALS — Ht 69.5 in | Wt 245.0 lb

## 2019-06-17 DIAGNOSIS — I480 Paroxysmal atrial fibrillation: Secondary | ICD-10-CM | POA: Diagnosis not present

## 2019-06-17 DIAGNOSIS — D6869 Other thrombophilia: Secondary | ICD-10-CM

## 2019-06-17 DIAGNOSIS — I10 Essential (primary) hypertension: Secondary | ICD-10-CM | POA: Diagnosis not present

## 2019-06-17 NOTE — Progress Notes (Signed)
Electrophysiology TeleHealth Note   Due to national recommendations of social distancing due to [place] 19, an audio telehealth visit is felt to be most appropriate for this patient at this time.  His camera is not working today.  See MyChart message from today for the patient's consent to telehealth for [facility].  Date:  06/17/2019   ID:  Kent James, DOB 04-29-1945, MRN [id number]  Location: patient's home  Provider location:  [place]  Evaluation Performed: Follow-up visit  PCP:  Jearld Fenton, NP   Electrophysiologist:  Dr Rayann Heman  Chief Complaint:  palpitations  History of Present Illness:    Kent James is a 74 y.o. male who presents via telehealth conferencing today.  Since last being seen in our clinic, the patient reports doing very well.  Today, he denies symptoms of palpitations, chest pain, shortness of breath,  lower extremity edema, dizziness, presyncope, or syncope.  The patient is otherwise without complaint today.   Past Medical History:  Diagnosis Date  . Childhood asthma   . Essential hypertension   . GERD (gastroesophageal reflux disease)   . History of chicken pox   . History of colon polyps   . Hyperlipidemia   . Persistent atrial fibrillation ([place])   . Snoring     Past Surgical History:  Procedure Laterality Date  . ATRIAL FIBRILLATION ABLATION N/A 12/11/2018   Procedure: ATRIAL FIBRILLATION ABLATION;  Surgeon: Thompson Grayer, MD;  Location: [place] [facility];  Service: Cardiovascular;  Laterality: N/A;  . CARDIOVERSION    . TEE WITHOUT CARDIOVERSION N/A 12/11/2018   Procedure: TRANSESOPHAGEAL ECHOCARDIOGRAM (TEE);  Surgeon: Elouise Munroe, MD;  Location: [place];  Service: Cardiology;  Laterality: N/A;    Current Outpatient Medications  Medication Sig Dispense Refill  . apixaban (ELIQUIS) 5 MG TABS tablet Take 1 tablet (5 mg total) by mouth 2 (two) times daily. 180 tablet 3  . omeprazole (PRILOSEC) 20 MG capsule Take  20 mg by mouth daily.    Marland Kitchen lisinopril (ZESTRIL) 10 MG tablet Take 1 tablet (10 mg total) by mouth daily. 90 tablet 3   No current facility-administered medications for this visit.    Allergies:   Patient has no known allergies.   Social History:  The patient  reports that he has quit smoking. He started smoking about 2 years ago. He has never used smokeless tobacco. He reports current alcohol use. He reports that he does not use drugs.   ROS:  Please see the history of present illness.   All other systems are personally reviewed and negative.    Exam:    Vital Signs:  Ht 5' 9.5" (1.765 m)   Wt 245 lb (111.1 kg)   BMI 35.66 kg/m   Well sounding, alert and conversant   Labs/Other Tests and Data Reviewed:    Recent Labs: 11/09/2018: TSH 1.500 01/08/2019: Magnesium 2.0 03/19/2019: ALT 16; BUN 11; Creatinine, Ser 0.88; Hemoglobin 16.3; Platelets 175.0; Potassium 4.2; Sodium 138   Wt Readings from Last 3 Encounters:  06/17/19 245 lb (111.1 kg)  03/19/19 245 lb (111.1 kg)  03/11/19 247 lb (112 kg)     ASSESSMENT & PLAN:    1.  Persistent afib Doing great post ablation off AAD therapy He is pleased with current state chads2vasc score is 2.  He will stay on eliquis  2. HTN Stable No change required today  3. Obesity We discussed lifestyle modification at length today   Risks, benefits and potential  toxicities for medications prescribed and/or refilled reviewed with patient today.   Follow-up: 3 months with AF clinic, 6 months with me   Patient Risk:  after full review of this patients clinical status, I feel that they are at moderate risk at this time.  Today, I have spent 15 minutes with the patient with telehealth technology discussing arrhythmia management .    Army Fossa, MD  06/17/2019 10:12 AM     [facility] [address] [place] [address] [phone] (office) [phone] (fax)

## 2019-07-26 ENCOUNTER — Encounter: Payer: Self-pay | Admitting: Internal Medicine

## 2019-07-31 ENCOUNTER — Other Ambulatory Visit: Payer: Self-pay | Admitting: Cardiology

## 2019-07-31 NOTE — Telephone Encounter (Signed)
This is Dr. Schumann's pt 

## 2019-08-23 ENCOUNTER — Encounter: Payer: Self-pay | Admitting: Internal Medicine

## 2019-08-23 DIAGNOSIS — R0981 Nasal congestion: Secondary | ICD-10-CM

## 2019-09-10 DIAGNOSIS — J330 Polyp of nasal cavity: Secondary | ICD-10-CM | POA: Diagnosis not present

## 2019-09-10 DIAGNOSIS — J019 Acute sinusitis, unspecified: Secondary | ICD-10-CM | POA: Diagnosis not present

## 2019-09-12 ENCOUNTER — Telehealth: Payer: Self-pay | Admitting: Internal Medicine

## 2019-09-12 NOTE — Telephone Encounter (Signed)
Sandy from Dr. Darien Ramus office is calling stating she is returning Mindy's call. Please advise.

## 2019-09-12 NOTE — Telephone Encounter (Signed)
   [facility] Pre-operative Risk Assessment    Request for surgical clearance:  1. What type of surgery is being performed?  Possible biopsy at next follow-up appointment 9/21  2. When is this surgery scheduled? 9/21  3. What type of clearance is required (medical clearance vs. Pharmacy clearance to hold med vs. Both)? Pharmacy to hold Eliquis for possible biopsy  4. Are there any medications that need to be held prior to surgery and how long? Eliquis (in case same day biopsy is indicated)  5. Practice name and name of physician performing surgery? [facility]  6. What is the office phone number? [phone] ex:[phone]   7.   What is the office fax number? [phone] attn: Sandy  8.   Anesthesia type (None, local, MAC, general)? Local (lidocaine with epi)   Kent James 09/12/2019, 8:43 AM  _________________________________________________________________   (provider comments below)

## 2019-09-12 NOTE — Telephone Encounter (Signed)
Patient with diagnosis of A Fib on Eliquis for anticoagulation.    Procedure: Possible biopsy at next follow-up appointment 9/21 Date of procedure: 9/21  CHADS2-VASc score of  2 ( HTN, AGE)  CrCl 116 mL/min  Per office protocol, patient can hold Eliquis for 1-2 days prior to procedure.     Patient will not need bridging with Lovenox (enoxaparin) around procedure.  If not bridging, patient should restart Eliquis on the evening of procedure or day after, at discretion of procedure MD

## 2019-09-12 NOTE — Telephone Encounter (Signed)
   Primary Cardiologist: Dr Rayann Heman  Chart reviewed and patient contacted by phone as part of pre-operative protocol coverage. Given past medical history and time since last visit, based on ACC/AHA guidelines, Aravind Chrismer would be at acceptable risk for the planned procedure without further cardiovascular testing.   OK to hold Eliquis 2 days pre op if needed.  The patient was advised that if he develops new symptoms prior to surgery to contact our office to arrange for a follow-up visit, and he verbalized understanding.  I will route this recommendation to the requesting party via Epic fax function and remove from pre-op pool.  Please call with questions.  Kerin Ransom, PA-C 09/12/2019, 11:35 AM

## 2019-09-24 DIAGNOSIS — J330 Polyp of nasal cavity: Secondary | ICD-10-CM | POA: Diagnosis not present

## 2019-09-30 ENCOUNTER — Telehealth: Payer: Self-pay | Admitting: Internal Medicine

## 2019-09-30 NOTE — Telephone Encounter (Signed)
     [facility] Pre-operative Risk Assessment    HEARTCARE STAFF: - Please ensure there is not already an duplicate clearance open for this procedure. - Under Visit Info/Reason for Call, type in Other and utilize the format Clearance MM/DD/YY or Clearance TBD. Do not use dashes or single digits. - If request is for dental extraction, please clarify the # of teeth to be extracted.  Request for surgical clearance:  1. What type of surgery is being performed?  Imaging Guided Sinus Surgery , tissue removal   2. When is this surgery scheduled? 10/29/19   3. What type of clearance is required (medical clearance vs. Pharmacy clearance to hold med vs. Both)? Pharmacy Clearance   Are there any medications that need to be held prior to surgery and how long?apixaban (ELIQUIS) 5 MG TABS tablet [[id number]]  They need to know when pt needs to stop meds pre surgery and post surgery instructions   4. Practice name and name of physician performing surgery? [facility]    5. What is the office phone number? [phone]   7.   What is the office fax number? [phone]  8.   Anesthesia type (None, local, MAC, general) ? General    Kent James 09/30/2019, 11:21 AM  _________________________________________________________________   (provider comments below)

## 2019-10-01 NOTE — Telephone Encounter (Signed)
Patient with diagnosis of A Fib on Eliquis for anticoagulation.    Procedure:  Imaging Guided Sinus Surgery , tissue removal  Date of procedure: 10/29/19  CHADS2-VASc score of  2   (HTN, AGE)   CrCl 90 mL/min using adjusted body weight Platelet count: 175K  Per office protocol, patient can hold Eliquis for 2 days prior to procedure.   Patient will not need bridging with Lovenox (enoxaparin) around procedure.  If not bridging, patient should restart Eliquis on the evening of procedure or day after, at discretion of procedure MD

## 2019-10-15 ENCOUNTER — Other Ambulatory Visit: Payer: Self-pay

## 2019-10-15 ENCOUNTER — Ambulatory Visit (HOSPITAL_COMMUNITY)
Admission: RE | Admit: 2019-10-15 | Discharge: 2019-10-15 | Disposition: A | Discharge status: Home / Self Care | Payer: Medicare HMO | Source: Ambulatory Visit | Attending: Physician Assistant | Admitting: Physician Assistant

## 2019-10-15 VITALS — BP 160/96 | HR 71 | Ht 69.5 in | Wt 252.6 lb

## 2019-10-15 DIAGNOSIS — Z79899 Other long term (current) drug therapy: Secondary | ICD-10-CM | POA: Insufficient documentation

## 2019-10-15 DIAGNOSIS — Z7901 Long term (current) use of anticoagulants: Secondary | ICD-10-CM | POA: Diagnosis not present

## 2019-10-15 DIAGNOSIS — E669 Obesity, unspecified: Secondary | ICD-10-CM | POA: Insufficient documentation

## 2019-10-15 DIAGNOSIS — Z6836 Body mass index (BMI) 36.0-36.9, adult: Secondary | ICD-10-CM | POA: Insufficient documentation

## 2019-10-15 DIAGNOSIS — Z8249 Family history of ischemic heart disease and other diseases of the circulatory system: Secondary | ICD-10-CM | POA: Insufficient documentation

## 2019-10-15 DIAGNOSIS — D6869 Other thrombophilia: Secondary | ICD-10-CM

## 2019-10-15 DIAGNOSIS — E785 Hyperlipidemia, unspecified: Secondary | ICD-10-CM | POA: Insufficient documentation

## 2019-10-15 DIAGNOSIS — I10 Essential (primary) hypertension: Secondary | ICD-10-CM | POA: Diagnosis not present

## 2019-10-15 DIAGNOSIS — I480 Paroxysmal atrial fibrillation: Secondary | ICD-10-CM | POA: Diagnosis not present

## 2019-10-15 DIAGNOSIS — Z87891 Personal history of nicotine dependence: Secondary | ICD-10-CM | POA: Diagnosis not present

## 2019-10-15 DIAGNOSIS — K219 Gastro-esophageal reflux disease without esophagitis: Secondary | ICD-10-CM | POA: Diagnosis not present

## 2019-10-15 NOTE — Progress Notes (Signed)
Primary Care Physician: Jearld Abiola Behring, NP Primary Cardiologist: Dr Gardiner Rhyme Primary Electrophysiologist: Dr Rayann Heman Referring Physician: Dr Lorrene Reid Kent James is a 74 y.o. male with a history of paroxysmal atrial fibrillation and HTN who presents for follow up in the [facility].  The patient was initially diagnosed with atrial fibrillation in 2018 after presenting with symptoms of shortness of breath and an irregular pulse. He underwent two DCCV in 2018 and was started on sotalol. He had done well until 10/23/18 when he again had afib symptoms with heart rates in the 170s. He presented to the ER but converted to SR before being seen. He was seen by Dr Gardiner Rhyme and started on anticoagulation. He is on Eliquis for a CHADS2VASC score of 2. Patient does admit to snoring and witnessed apnea and a sleep study has been ordered. Patient reports that 11/05/18 he woke up in the middle of the night and felt he was back out of rhythm. He does have symptoms of fatigue with exertion and palpitations. Patient is s/p afib ablation on 12/11/18 with Dr Rayann Heman. He did well post ablation and his sotalol was discontinued.   On follow up today, patient reports he has done well since his last visit. He has not had to use PRN CCB. He does have a nasal polyp which will be removed later this month. He denies any bleeding issues on anticoagulation.   Today, he denies symptoms of palpitations, chest pain, shortness of breath, orthopnea, PND, lower extremity edema, dizziness, presyncope, syncope, bleeding, or neurologic sequela. The patient is tolerating medications without difficulties and is otherwise without complaint today.    Atrial Fibrillation Risk Factors:  he does not have symptoms or diagnosis of sleep apnea. he does not have a history of rheumatic fever. he does not have a history of alcohol use. The patient does not have a history of early familial atrial fibrillation or  other arrhythmias.  he has a BMI of Body mass index is 36.77 kg/m.Marland Kitchen Filed Weights   10/15/19 1002  Weight: 114.6 kg    Family History  Problem Relation Age of Onset   Heart attack Father    Breast cancer Sister    Arthritis Maternal Grandmother      Atrial Fibrillation Management history:  Previous antiarrhythmic drugs: sotalol Previous cardioversions: 2018 x2 Previous ablations: 12/11/18 CHADS2VASC score: 2 Anticoagulation history: Eliquis   Past Medical History:  Diagnosis Date   Childhood asthma    Essential hypertension    GERD (gastroesophageal reflux disease)    History of chicken pox    History of colon polyps    Hyperlipidemia    Persistent atrial fibrillation ([facility])    Snoring    Past Surgical History:  Procedure Laterality Date   ATRIAL FIBRILLATION ABLATION N/A 12/11/2018   Procedure: ATRIAL FIBRILLATION ABLATION;  Surgeon: Thompson Grayer, MD;  Location: [place] [facility];  Service: Cardiovascular;  Laterality: N/A;   CARDIOVERSION     TEE WITHOUT CARDIOVERSION N/A 12/11/2018   Procedure: TRANSESOPHAGEAL ECHOCARDIOGRAM (TEE);  Surgeon: Elouise Munroe, MD;  Location: [place];  Service: Cardiology;  Laterality: N/A;    Current Outpatient Medications  Medication Sig Dispense Refill   apixaban (ELIQUIS) 5 MG TABS tablet Take 1 tablet (5 mg total) by mouth 2 (two) times daily. 180 tablet 3   fluticasone (FLONASE) 50 MCG/ACT nasal spray Place into both nostrils.     lisinopril (ZESTRIL) 10 MG tablet TAKE 1 TABLET BY MOUTH EVERY  DAY 90 tablet 3   omeprazole (PRILOSEC) 20 MG capsule Take 20 mg by mouth daily.     No current facility-administered medications for this encounter.    No Known Allergies  Social History   Socioeconomic History   Marital status: Married    Spouse name: Not on file   Number of children: Not on file   Years of education: Not on file   Highest education level: Not on file  Occupational History    Not on file  Tobacco Use   Smoking status: Former Smoker    Start date: 03/14/2017   Smokeless tobacco: Never Used  Vaping Use   Vaping Use: Never used  Substance and Sexual Activity   Alcohol use: Yes    Comment: daily liquor previously,  no alcohol in the past month   Drug use: No   Sexual activity: Not on file  Other Topics Concern   Not on file  Social History Narrative   Lives in [place] with spouse.   Retired Hotel manager   Social Determinants of Radio broadcast assistant Strain:    Difficulty of Paying Living Expenses: Not on [employer] Insecurity:    Worried About Charity fundraiser in the Last Year: Not on file   [employer] of Food in the Last Year: Not on file  Transportation Needs:    Film/video editor (Medical): Not on file   Lack of Transportation (Non-Medical): Not on file  Physical Activity:    Days of Exercise per Week: Not on file   Minutes of Exercise per Session: Not on file  Stress:    Feeling of Stress : Not on file  Social Connections:    Frequency of Communication with Friends and Family: Not on file   Frequency of Social Gatherings with Friends and Family: Not on file   Attends Religious Services: Not on file   Active Member of Clubs or Organizations: Not on file   Attends Archivist Meetings: Not on file   Marital Status: Not on file  Intimate Partner Violence:    Fear of Current or Ex-Partner: Not on file   Emotionally Abused: Not on file   Physically Abused: Not on file   Sexually Abused: Not on file     ROS- All systems are reviewed and negative except as per the HPI above.  Physical Exam: Vitals:   10/15/19 1002  BP: (!) 160/96  Pulse: 71  Weight: 114.6 kg  Height: 5' 9.5" (1.765 m)    GEN- The patient is well appearing obese elderly male, alert and oriented x 3 today.   HEENT-head normocephalic, atraumatic, sclera clear, conjunctiva pink, hearing intact, trachea midline. Lungs- Clear to  ausculation bilaterally, normal work of breathing Heart- Regular rate and rhythm, no murmurs, rubs or gallops  GI- soft, NT, ND, + BS Extremities- no clubbing, cyanosis, or edema MS- no significant deformity or atrophy Skin- no rash or lesion Psych- euthymic mood, full affect Neuro- strength and sensation are intact   Wt Readings from Last 3 Encounters:  10/15/19 114.6 kg  06/17/19 111.1 kg  03/19/19 111.1 kg    EKG today demonstrates SR HR 71, PR 186, QRS 88, QTc 410  Echo 11/12/18 1. Left ventricular ejection fraction, by visual estimation, is 65 to 70%. The left ventricle has hyperdynamic function. There is moderately increased left ventricular hypertrophy.  2. Global right ventricle has normal systolic function.The right ventricular size is normal. No increase in right ventricular  wall thickness.  3. Left atrial size was normal.  4. Right atrial size was normal.  5. The mitral valve is normal in structure. No evidence of mitral valve regurgitation.  6. The tricuspid valve is normal in structure. Tricuspid valve regurgitation is trivial.  7. The aortic valve is tricuspid. Aortic valve regurgitation is mild. Mild aortic valve sclerosis without stenosis.  8. The pulmonic valve was grossly normal. Pulmonic valve regurgitation is not visualized.   Epic records are reviewed at length today  Assessment and Plan:  1. Paroxysmal atrial fibrillation S/p afib ablation 12/11/18. Now off AAD. Patient appears to be maintaining SR.  Continue Eliquis 5 mg BID Continue diltiazem 30 mg PRN q4hrs for heart rate >100bpm.  This patients CHA2DS2-VASc Score and unadjusted Ischemic Stroke Rate (% per year) is equal to 2.2 % stroke rate/year from a score of 2  Above score calculated as 1 point each if present [CHF, HTN, DM, Vascular=MI/PAD/Aortic Plaque, Age if 65-74, or Male] Above score calculated as 2 points each if present [Age > 75, or Stroke/TIA/TE]  2. Obesity Body mass index is 36.77  kg/m. Lifestyle modification was discussed and encouraged including regular physical activity and weight reduction.  3. HTN Elevated today. Patient reports at his other recent doctor visits his BP has been normal. Encouraged patient to keep BP log and to reach out if his BP remains elevated.    Follow up with Dr Rayann Heman as scheduled. AF clinic in 6 months.    [facility] [address] [place], [place] [address] [phone] 10/15/2019 10:36 AM

## 2019-10-16 DIAGNOSIS — J320 Chronic maxillary sinusitis: Secondary | ICD-10-CM | POA: Diagnosis not present

## 2019-10-16 DIAGNOSIS — J330 Polyp of nasal cavity: Secondary | ICD-10-CM | POA: Diagnosis not present

## 2019-10-23 ENCOUNTER — Other Ambulatory Visit: Payer: Self-pay

## 2019-10-23 ENCOUNTER — Other Ambulatory Visit
Admission: RE | Admit: 2019-10-23 | Discharge: 2019-10-23 | Disposition: A | Discharge status: Home / Self Care | Payer: Medicare HMO | Source: Ambulatory Visit | Attending: Otolaryngology | Admitting: Otolaryngology

## 2019-10-23 NOTE — Patient Instructions (Signed)
Your procedure is scheduled on: Tuesday 10/29/19.  Report to DAY SURGERY DEPARTMENT LOCATED ON 2ND FLOOR MEDICAL MALL ENTRANCE. To find out your arrival time please call [phone] between 1PM - 3PM on Monday 10/28/19.   Remember: Instructions that are not followed completely may result in serious medical risk, up to and including death, or upon the discretion of your surgeon and anesthesiologist your surgery may need to be rescheduled.     __X__ 1. Do not eat food after midnight the night before your procedure.                 No gum chewing or hard candies. You may drink clear liquids up to 2 hours                 before you are scheduled to arrive for your surgery- DO NOT drink clear                 liquids within 2 hours of the start of your surgery.                 Clear Liquids include:  water, apple juice without pulp, clear carbohydrate                 drink such as Clearfast or Gatorade, Black Coffee or Tea (Do not add                 milk or creamer to coffee or tea).  __X__2.  On the morning of surgery brush your teeth with toothpaste and water, you may rinse your mouth with mouthwash if you wish.  Do not swallow any toothpaste or mouthwash.    __X__ 3.  No Alcohol for 24 hours before or after surgery.  __X__ 4.  Do Not Smoke or use e-cigarettes For 24 Hours Prior to Your Surgery.                 Do not use any chewable tobacco products for at least 6 hours prior to                 surgery.  __X__5.  Notify your doctor if there is any change in your medical condition      (cold, fever, infections).      Do NOT wear jewelry, make-up, hairpins, clips or nail polish. Do NOT wear lotions, powders, or perfumes.  Do NOT shave 48 hours prior to surgery. Men may shave face and neck. Do NOT bring valuables to the hospital.     [facility] is not responsible for any belongings or valuables.   Contacts, dentures/partials or body piercings may not be worn into surgery. Bring  a case for your contacts, glasses or hearing aids, a denture cup will be supplied.    Patients discharged the day of surgery will not be allowed to drive home.     __X__ Take these medicines the morning of surgery with A SIP OF WATER:     1. omeprazole (PRILOSEC)     __X__ Shower before your arrival on the morning of your surgery.    __X__ Stop Blood Thinners: Eliquis 2 days prior to your procedure according to your Cardiologist's instructions ([name]).   __X__ Stop Anti-inflammatories 7 days before surgery such as Advil, Ibuprofen, Motrin, BC or Goodies Powder, Naprosyn, Naproxen, Aleve, Aspirin, Meloxicam. May take Tylenol if needed for pain or discomfort.   __X__Do not start taking any new herbal supplements or vitamins prior to  your procedure.     Wear comfortable clothing (specific to your surgery type) to the hospital.  Plan for stool softeners for home use; pain medications have a tendency to cause constipation. You can also help prevent constipation by eating foods high in fiber such as fruits and vegetables and drinking plenty of fluids as your diet allows.  After surgery, you can prevent lung complications by doing breathing exercises.Take deep breaths and cough every 1-2 hours. Your doctor may order a device called an Incentive Spirometer to help you take deep breaths.  Please call the [facility] at [phone] if you have any questions about these instructions.

## 2019-10-25 ENCOUNTER — Other Ambulatory Visit: Payer: Self-pay

## 2019-10-25 ENCOUNTER — Other Ambulatory Visit
Admission: RE | Admit: 2019-10-25 | Discharge: 2019-10-25 | Disposition: A | Discharge status: Home / Self Care | Payer: Medicare HMO | Source: Ambulatory Visit | Attending: Otolaryngology | Admitting: Otolaryngology

## 2019-10-25 DIAGNOSIS — Z01812 Encounter for preprocedural laboratory examination: Secondary | ICD-10-CM | POA: Diagnosis not present

## 2019-10-25 DIAGNOSIS — Z20822 Contact with and (suspected) exposure to covid-19: Secondary | ICD-10-CM | POA: Diagnosis not present

## 2019-10-25 LAB — BASIC METABOLIC PANEL
Anion gap: 11 (ref 5–15)
BUN: 11 mg/dL (ref 8–23)
CO2: 28 mmol/L (ref 22–32)
Calcium: 9 mg/dL (ref 8.9–10.3)
Chloride: 101 mmol/L (ref 98–111)
Creatinine, Ser: 1.1 mg/dL (ref 0.61–1.24)
GFR, Estimated: >60 mL/min (ref >60)
Glucose, Bld: 121 mg/dL — ABNORMAL HIGH (ref 70–99)
Potassium: 3.4 mmol/L — ABNORMAL LOW (ref 3.5–5.1)
Sodium: 140 mmol/L (ref 135–145)

## 2019-10-25 LAB — CBC
HCT: 48 % (ref 39.0–52.0)
Hemoglobin: 17.1 g/dL — ABNORMAL HIGH (ref 13.0–17.0)
MCH: 31.7 pg (ref 26.0–34.0)
MCHC: 35.6 g/dL (ref 30.0–36.0)
MCV: 88.9 fL (ref 80.0–100.0)
Platelets: 181 10*3/uL (ref 150–400)
RBC: 5.4 MIL/uL (ref 4.22–5.81)
RDW: 13.6 % (ref 11.5–15.5)
WBC: 8.9 10*3/uL (ref 4.0–10.5)
nRBC: 0 % (ref 0.0–0.2)

## 2019-10-26 LAB — SARS CORONAVIRUS 2 (TAT 6-24 HRS): SARS Coronavirus 2: NEGATIVE

## 2019-10-29 ENCOUNTER — Ambulatory Visit
Admission: RE | Admit: 2019-10-29 | Discharge: 2019-10-29 | Disposition: A | Discharge status: Home / Self Care | Payer: Medicare HMO | Attending: Otolaryngology | Admitting: Otolaryngology

## 2019-10-29 ENCOUNTER — Ambulatory Visit: Payer: Medicare HMO | Admitting: Certified Registered Nurse Anesthetist

## 2019-10-29 ENCOUNTER — Other Ambulatory Visit: Payer: Self-pay

## 2019-10-29 ENCOUNTER — Ambulatory Visit: Payer: Medicare HMO | Admitting: Urgent Care

## 2019-10-29 ENCOUNTER — Encounter
Admission: RE | Disposition: A | Discharge status: Home / Self Care | Payer: Self-pay | Source: Home / Self Care | Attending: Otolaryngology

## 2019-10-29 ENCOUNTER — Encounter: Payer: Self-pay | Admitting: Otolaryngology

## 2019-10-29 DIAGNOSIS — J320 Chronic maxillary sinusitis: Secondary | ICD-10-CM | POA: Diagnosis not present

## 2019-10-29 DIAGNOSIS — J329 Chronic sinusitis, unspecified: Secondary | ICD-10-CM | POA: Diagnosis not present

## 2019-10-29 DIAGNOSIS — D140 Benign neoplasm of middle ear, nasal cavity and accessory sinuses: Secondary | ICD-10-CM | POA: Diagnosis not present

## 2019-10-29 DIAGNOSIS — J339 Nasal polyp, unspecified: Secondary | ICD-10-CM | POA: Diagnosis not present

## 2019-10-29 DIAGNOSIS — J330 Polyp of nasal cavity: Secondary | ICD-10-CM | POA: Diagnosis not present

## 2019-10-29 HISTORY — PX: IMAGE GUIDED SINUS SURGERY: SHX6570

## 2019-10-29 HISTORY — PX: MAXILLARY ANTROSTOMY: SHX2003

## 2019-10-29 SURGERY — SINUS SURGERY, WITH IMAGING GUIDANCE
Anesthesia: General | Laterality: Right

## 2019-10-29 MED ORDER — MIDAZOLAM HCL 2 MG/2ML IJ SOLN
INTRAMUSCULAR | Status: AC
  Filled 2019-10-29: qty 2

## 2019-10-29 MED ORDER — PROPOFOL 10 MG/ML IV BOLUS
INTRAVENOUS | Status: AC
  Filled 2019-10-29: qty 20

## 2019-10-29 MED ORDER — HYDROCODONE-ACETAMINOPHEN 5-325 MG PO TABS
1.0000 | ORAL_TABLET | ORAL | 0 refills | Status: DC | PRN
Start: 2019-10-29 — End: 2020-01-13

## 2019-10-29 MED ORDER — ONDANSETRON HCL 4 MG/2ML IJ SOLN
INTRAMUSCULAR | Status: DC | PRN
  Administered 2019-10-29: 4 mg via INTRAVENOUS

## 2019-10-29 MED ORDER — ROCURONIUM BROMIDE 100 MG/10ML IV SOLN
INTRAVENOUS | Status: DC | PRN
  Administered 2019-10-29: 10 mg via INTRAVENOUS
  Administered 2019-10-29 (×2): 30 mg via INTRAVENOUS

## 2019-10-29 MED ORDER — LACTATED RINGERS IV SOLN: INTRAVENOUS | Status: DC

## 2019-10-29 MED ORDER — FENTANYL CITRATE (PF) 100 MCG/2ML IJ SOLN
INTRAMUSCULAR | Status: DC | PRN
  Administered 2019-10-29 (×2): 50 ug via INTRAVENOUS

## 2019-10-29 MED ORDER — LIDOCAINE HCL (PF) 2 % IJ SOLN
INTRAMUSCULAR | Status: AC
  Filled 2019-10-29: qty 5

## 2019-10-29 MED ORDER — HEMOSTATIC AGENTS (NO CHARGE) OPTIME
TOPICAL | Status: DC | PRN
  Administered 2019-10-29 (×3): 1 via TOPICAL

## 2019-10-29 MED ORDER — ORAL CARE MOUTH RINSE: 15.0000 mL | Freq: Once | OROMUCOSAL | Status: AC

## 2019-10-29 MED ORDER — GLYCOPYRROLATE 0.2 MG/ML IJ SOLN
INTRAMUSCULAR | Status: DC | PRN
  Administered 2019-10-29: .2 mg via INTRAVENOUS

## 2019-10-29 MED ORDER — OXYMETAZOLINE HCL 0.05 % NA SOLN
NASAL | Status: AC
  Filled 2019-10-29: qty 30

## 2019-10-29 MED ORDER — MIDAZOLAM HCL 2 MG/2ML IJ SOLN
INTRAMUSCULAR | Status: DC | PRN
  Administered 2019-10-29 (×2): 1 mg via INTRAVENOUS

## 2019-10-29 MED ORDER — AMOXICILLIN-POT CLAVULANATE 875-125 MG PO TABS: 1.0000 | ORAL_TABLET | Freq: Two times a day (BID) | ORAL | 0 refills | Status: DC

## 2019-10-29 MED ORDER — CHLORHEXIDINE GLUCONATE 0.12 % MT SOLN
OROMUCOSAL | Status: AC
  Filled 2019-10-29: qty 15

## 2019-10-29 MED ORDER — DEXAMETHASONE SODIUM PHOSPHATE 10 MG/ML IJ SOLN
INTRAMUSCULAR | Status: DC | PRN
  Administered 2019-10-29: 10 mg via INTRAVENOUS

## 2019-10-29 MED ORDER — DEXAMETHASONE SODIUM PHOSPHATE 10 MG/ML IJ SOLN
INTRAMUSCULAR | Status: AC
  Filled 2019-10-29: qty 1

## 2019-10-29 MED ORDER — SUGAMMADEX SODIUM 500 MG/5ML IV SOLN
INTRAVENOUS | Status: AC
  Filled 2019-10-29: qty 5

## 2019-10-29 MED ORDER — SUGAMMADEX SODIUM 200 MG/2ML IV SOLN
INTRAVENOUS | Status: DC | PRN
  Administered 2019-10-29: 230 mg via INTRAVENOUS

## 2019-10-29 MED ORDER — OXYMETAZOLINE HCL 0.05 % NA SOLN
NASAL | Status: DC | PRN
  Administered 2019-10-29: 1 via TOPICAL

## 2019-10-29 MED ORDER — DOCUSATE SODIUM 100 MG PO CAPS: 100.0000 mg | ORAL_CAPSULE | Freq: Two times a day (BID) | ORAL | 0 refills | Status: DC

## 2019-10-29 MED ORDER — LIDOCAINE-EPINEPHRINE 1 %-1:100000 IJ SOLN
INTRAMUSCULAR | Status: AC
  Filled 2019-10-29: qty 1

## 2019-10-29 MED ORDER — FENTANYL CITRATE (PF) 100 MCG/2ML IJ SOLN
INTRAMUSCULAR | Status: AC
  Filled 2019-10-29: qty 2

## 2019-10-29 MED ORDER — ONDANSETRON HCL 4 MG/2ML IJ SOLN
INTRAMUSCULAR | Status: AC
  Filled 2019-10-29: qty 2

## 2019-10-29 MED ORDER — ACETAMINOPHEN 10 MG/ML IV SOLN
INTRAVENOUS | Status: DC | PRN
  Administered 2019-10-29: 1000 mg via INTRAVENOUS

## 2019-10-29 MED ORDER — ONDANSETRON HCL 4 MG/2ML IJ SOLN: 4.0000 mg | Freq: Once | INTRAMUSCULAR | Status: DC | PRN

## 2019-10-29 MED ORDER — PROPOFOL 10 MG/ML IV BOLUS
INTRAVENOUS | Status: DC | PRN
  Administered 2019-10-29: 130 mg via INTRAVENOUS

## 2019-10-29 MED ORDER — CHLORHEXIDINE GLUCONATE 0.12 % MT SOLN
15.0000 mL | Freq: Once | OROMUCOSAL | Status: AC
  Administered 2019-10-29: 15 mL via OROMUCOSAL

## 2019-10-29 MED ORDER — LIDOCAINE-EPINEPHRINE 1 %-1:100000 IJ SOLN
INTRAMUSCULAR | Status: DC | PRN
  Administered 2019-10-29: 4 mL

## 2019-10-29 MED ORDER — ACETAMINOPHEN 10 MG/ML IV SOLN
INTRAVENOUS | Status: AC
  Filled 2019-10-29: qty 100

## 2019-10-29 MED ORDER — FENTANYL CITRATE (PF) 100 MCG/2ML IJ SOLN: 25.0000 ug | INTRAMUSCULAR | Status: DC | PRN

## 2019-10-29 MED ORDER — ONDANSETRON HCL 4 MG PO TABS: 4.0000 mg | ORAL_TABLET | Freq: Three times a day (TID) | ORAL | 0 refills | Status: DC | PRN

## 2019-10-29 MED ORDER — PHENYLEPHRINE HCL (PRESSORS) 10 MG/ML IV SOLN
INTRAVENOUS | Status: DC | PRN
  Administered 2019-10-29 (×2): 100 ug via INTRAVENOUS

## 2019-10-29 SURGICAL SUPPLY — 42 items
BALLN SINUPLASTY KIT 6X16 (BALLOONS)
BALLOON SINUPLASTY KIT 6X16 (BALLOONS) IMPLANT
BATTERY INSTRU NAVIGATION (MISCELLANEOUS) ×6 IMPLANT
BTRY SRG DRVR LF (MISCELLANEOUS) ×4
CANISTER SUC SOCK COL 7IN (MISCELLANEOUS) ×3 IMPLANT
CANISTER SUCT 1200ML W/VALVE (MISCELLANEOUS) ×6 IMPLANT
CANISTER SUCT 3000ML PPV (MISCELLANEOUS) ×3 IMPLANT
CNTNR SPEC 2.5X3XGRAD LEK (MISCELLANEOUS) ×4
COAG SUCT 10F 3.5MM HAND CTRL (MISCELLANEOUS) ×3 IMPLANT
CONT SPEC 4OZ STER OR WHT (MISCELLANEOUS) ×2
CONT SPEC 4OZ STRL OR WHT (MISCELLANEOUS) ×4
CONTAINER SPEC 2.5X3XGRAD LEK (MISCELLANEOUS) ×4 IMPLANT
COVER WAND RF STERILE (DRAPES) ×3 IMPLANT
DEVICE INFLATION SEID (MISCELLANEOUS) IMPLANT
DRESSING NASL FOAM PST OP SINU (MISCELLANEOUS) ×4 IMPLANT
DRSG NASAL FOAM POST OP SINU (MISCELLANEOUS) ×6
ELECT REM PT RETURN 9FT ADLT (ELECTROSURGICAL) ×3
ELECTRODE REM PT RTRN 9FT ADLT (ELECTROSURGICAL) ×2 IMPLANT
GAUZE PACK 2X3YD (PACKING) ×3 IMPLANT
GLOVE BIO SURGEON STRL SZ7.5 (GLOVE) ×12 IMPLANT
GOWN STRL REUS W/ TWL LRG LVL3 (GOWN DISPOSABLE) ×4 IMPLANT
GOWN STRL REUS W/TWL LRG LVL3 (GOWN DISPOSABLE) ×6
IV NS 500ML (IV SOLUTION) ×3
IV NS 500ML BAXH (IV SOLUTION) ×2 IMPLANT
KIT TURNOVER KIT A (KITS) ×3 IMPLANT
NS IRRIG 500ML POUR BTL (IV SOLUTION) ×3 IMPLANT
PACK HEAD/NECK (MISCELLANEOUS) ×3 IMPLANT
PACKING NASAL EPIS 4X2.4 XEROG (MISCELLANEOUS) ×3 IMPLANT
PATTIES SURGICAL .5 X3 (DISPOSABLE) ×3 IMPLANT
SHAVER DIEGO BLD STD TYPE A (BLADE) ×3 IMPLANT
SOL ANTI-FOG 6CC FOG-OUT (MISCELLANEOUS) ×2 IMPLANT
SOL FOG-OUT ANTI-FOG 6CC (MISCELLANEOUS) ×1
SPLINT NASAL REUTER .5MM (MISCELLANEOUS) IMPLANT
SPLINT NASAL REUTER .5MM BIVLV (MISCELLANEOUS) IMPLANT
SUT CHROMIC 4 0 RB 1X27 (SUTURE) ×3 IMPLANT
SUT ETHILON 3 0 PS 1 (SUTURE) IMPLANT
SWAB CULTURE AMIES ANAERIB BLU (MISCELLANEOUS) IMPLANT
SYR 30ML LL (SYRINGE) ×3 IMPLANT
TRACKER CRANIALMASK (MASK) ×3 IMPLANT
TUBING CONNECTING 10 (TUBING) ×3 IMPLANT
TUBING DECLOG MULTIDEBRIDER (TUBING) ×3 IMPLANT
WATER STERILE IRR 1000ML POUR (IV SOLUTION) ×3 IMPLANT

## 2019-10-29 NOTE — H&P (Signed)
..  History and Physical paper copy reviewed and updated date of procedure and will be scanned into system.  Patient seen and examined and marked.  Patient stopped Xarelto 48 hours ago.

## 2019-10-29 NOTE — Op Note (Signed)
..  10/29/2019  11:45 AM    [name]  [id number]   Pre-Op Dx:  Chronic Rhinosinusitis refractory to medical treatment  Post-op Dx: Same  Proc:   1)  Image Guided Sinus Surgery,  2)  Right Maxillary Antrostomy with tissue removal  Surg:  Kent James  Anes:  General  EBL:  100  Comp:  None  Findings: Large obstructive inflammatory appearing polyp from right maxillary antrostomy with thinning of medial wall.  All of polyp removed and large maxillary antrostomy performed.  Procedure: After the patient was identified in holding and the benefits of the procedure were reviewed as well as the consent and risks.  The patient was taken to the operating room and with the patient in a comfortable supine position,  general orotracheal anesthesia was induced without difficulty.  A proper time-out was performed.  The [employer] image guidance system was set up and calibrated in the normal fashion with an acceptable error of 0.20mm.       The patient next received preoperative Afrin spray for topical decongestion and vasoconstriction and 1% Xylocaine with 1:100,000 epinephrine, 4 cc's, was infiltrated into the inferior turbinates, septum, and anterior middle turbinates on right.  Several minutes were allowed for this to take effect.  Cottoniod pledgets soaked in Afrin were placed into both nasal cavities and left while the patient was prepped and draped in the standard fashion.  The materials were removed from the nose and observed to be intact and correct in number.  The nose was next inspected with a zero degree endoscope and the middle turbinates were medialized and afrin soaked pledgets were placed lateral to the turbinates for approximately one minute.  At this time attention was directed to the patient's right maxillary sinus.  On the right, a large obstructive polypoid mass was noted in the patient's nasal cavity.  This was traced to the maxillary os.  A straight biting forcep was used  to debride this polyp until its insertion into the maxillary sinus.  The uncinate remnant and inferior maxillary wall was reduced with backbiting forceps.  A 30 degree scope was used in combination with 45 degree biting forcep and this was used top remove polypoid and inflammed tissue with the maxillary sinus.  Microdebrider was used to enlarged the opening and remove all polypoid appearing mucosa on the maxillary antrostomy.  Hemostasis was performed with topical Afrin soaked pledgets.  Visualization with a 30 and zero degree endoscope was used to examine the right maxillary antrostomy and sinus cavity which was noted to be widely patent and in continuity with the natural os.  No residual polypoid mass was noted.  At this time, the patient's sinuses were irrigated with sterile saline.  Hemostasis was continued.  Stamberger sinufoam was placed within the right maxillary sinus and along the antrostomy.  A Xerogel pack was placed lateral to the middle turbinate and this was inflated with sterile saline.  Stamberger was also placed on the outside of the patient's xerogel.  Hemostasis was continued.  Care of the patient at this time was transferred to anesthesia and was extubated and taken to PACU in good condition.   Dispo:   PACU to home  Plan: Ice, elevation, narcotic analgesia and prophylactic antibiotics. We will reevaluate the patient in the office in 7 days.  Return to work in 7-10 days, strenuous activities in two weeks.   Kent James 10/29/2019 11:45 AM

## 2019-10-29 NOTE — Anesthesia Preprocedure Evaluation (Signed)
Anesthesia Evaluation  Patient identified by MRN, date of birth, ID band Patient awake    Reviewed: Allergy & Precautions, H&P , NPO status , Patient's Chart, lab work & pertinent test results, reviewed documented beta blocker date and time   History of Anesthesia Complications Negative for: history of anesthetic complications  Airway Mallampati: II  TM Distance: >3 FB Neck ROM: full    Dental  (+) Partial Lower, Poor Dentition, Upper Dentures, Edentulous Upper, Dental Advidsory Given   Pulmonary neg shortness of breath, asthma (as a child) , neg sleep apnea, neg COPD, neg recent URI, former smoker,    Pulmonary exam normal breath sounds clear to auscultation       Cardiovascular Exercise Tolerance: Good hypertension, (-) angina(-) Past MI and (-) Cardiac Stents + dysrhythmias (s/p ablation) Atrial Fibrillation (-) Valvular Problems/Murmurs Rhythm:regular Rate:Normal     Neuro/Psych negative neurological ROS  negative psych ROS   GI/Hepatic Neg liver ROS, GERD  ,  Endo/Other  negative endocrine ROS  Renal/GU negative Renal ROS  negative genitourinary   Musculoskeletal   Abdominal   Peds  Hematology negative hematology ROS (+)   Anesthesia Other Findings Past Medical History: No date: Childhood asthma No date: Essential hypertension No date: GERD (gastroesophageal reflux disease) No date: History of chicken pox No date: History of colon polyps No date: Hyperlipidemia No date: Persistent atrial fibrillation (HCC) No date: Snoring   Reproductive/Obstetrics negative OB ROS                             Anesthesia Physical Anesthesia Plan  ASA: II  Anesthesia Plan: General   Post-op Pain Management:    Induction: Intravenous  PONV Risk Score and Plan: 2 and Ondansetron, Dexamethasone and Treatment may vary due to age or medical condition  Airway Management Planned: Oral  ETT  Additional Equipment:   Intra-op Plan:   Post-operative Plan: Extubation in OR  Informed Consent: I have reviewed the patients History and Physical, chart, labs and discussed the procedure including the risks, benefits and alternatives for the proposed anesthesia with the patient or authorized representative who has indicated his/her understanding and acceptance.     Dental Advisory Given  Plan Discussed with: Anesthesiologist, CRNA and Surgeon  Anesthesia Plan Comments:         Anesthesia Quick Evaluation

## 2019-10-29 NOTE — Discharge Instructions (Signed)

## 2019-10-29 NOTE — Transfer of Care (Signed)
Immediate Anesthesia Transfer of Care Note  Patient: Kent James  Procedure(s) Performed: IMAGE GUIDED SINUS SURGERY (N/A ) MAXILLARY ANTROSTOMY (Right )  Patient Location: PACU  Anesthesia Type:General  Level of Consciousness: drowsy and patient cooperative  Airway & Oxygen Therapy: Patient Spontanous Breathing and Patient connected to face mask oxygen  Post-op Assessment: Report given to RN and Post -op Vital signs reviewed and stable  Post vital signs: Reviewed and stable  Last Vitals:  Vitals Value Taken Time  BP 167/99 10/29/19 1203  Temp    Pulse 92 10/29/19 1203  Resp 15 10/29/19 1203  SpO2 92 % 10/29/19 1203  Vitals shown include unvalidated device data.  Last Pain:  Vitals:   10/29/19 0802  TempSrc: Temporal  PainSc: 0-No pain         Complications: No complications documented.

## 2019-10-29 NOTE — Anesthesia Procedure Notes (Signed)
Procedure Name: Intubation Date/Time: 10/29/2019 10:36 AM Performed by: Jonna Clark, CRNA Pre-anesthesia Checklist: Patient identified, Patient being monitored, Timeout performed, Emergency Drugs available and Suction available Patient Re-evaluated:Patient Re-evaluated prior to induction Oxygen Delivery Method: Circle system utilized Preoxygenation: Pre-oxygenation with 100% oxygen Induction Type: IV induction Ventilation: Mask ventilation without difficulty Laryngoscope Size: Mac and 4 Grade View: Grade I Tube type: Oral Rae Tube size: 7.5 mm Number of attempts: 1 Airway Equipment and Method: Stylet Placement Confirmation: ETT inserted through vocal cords under direct vision,  positive ETCO2 and breath sounds checked- equal and bilateral Secured at: 21 cm Tube secured with: Tape Dental Injury: Teeth and Oropharynx as per pre-operative assessment

## 2019-10-30 ENCOUNTER — Encounter: Payer: Self-pay | Admitting: Otolaryngology

## 2019-10-30 LAB — SURGICAL PATHOLOGY

## 2019-10-30 NOTE — Anesthesia Postprocedure Evaluation (Signed)
Anesthesia Post Note  Patient: Kent James  Procedure(s) Performed: IMAGE GUIDED SINUS SURGERY (N/A ) MAXILLARY ANTROSTOMY (Right )  Patient location during evaluation: PACU Anesthesia Type: General Level of consciousness: awake and alert Pain management: pain level controlled Vital Signs Assessment: post-procedure vital signs reviewed and stable Respiratory status: spontaneous breathing, nonlabored ventilation, respiratory function stable and patient connected to nasal cannula oxygen Cardiovascular status: blood pressure returned to baseline and stable Postop Assessment: no apparent nausea or vomiting Anesthetic complications: no   No complications documented.   Last Vitals:  Vitals:   10/29/19 1244 10/29/19 1307  BP: (!) 165/90 (!) 158/96  Pulse: 80 74  Resp: 16 16  Temp: 36.6 C 36.6 C  SpO2: 97% 97%    Last Pain:  Vitals:   10/29/19 1307  TempSrc: Temporal  PainSc: 0-No pain                 Martha Clan

## 2019-10-31 ENCOUNTER — Other Ambulatory Visit: Payer: Self-pay | Admitting: Cardiology

## 2019-10-31 NOTE — Telephone Encounter (Signed)
Prescription refill request for Eliquis received.  Last office visit: 10/15/2019, [place] Scr: 1.10, 10/25/2019  Age: 74 yo Weight: 114.6 kg   Prescription refill sent.

## 2019-11-02 ENCOUNTER — Ambulatory Visit: Payer: Medicare HMO | Attending: Internal Medicine

## 2019-11-02 DIAGNOSIS — Z23 Encounter for immunization: Secondary | ICD-10-CM

## 2019-11-02 NOTE — Progress Notes (Signed)
   Covid-19 Vaccination Clinic  Name:  Kent James    MRN: [id number] DOB: 03-05-1945  11/02/2019  Kent James was observed post Covid-19 immunization for 15 minutes without incident. He was provided with Vaccine Information Sheet and instruction to access the V-Safe system.   Kent James was instructed to call 911 with any severe reactions post vaccine: Marland Kitchen Difficulty breathing  . Swelling of face and throat  . A fast heartbeat  . A bad rash all over body  . Dizziness and weakness

## 2019-11-06 DIAGNOSIS — Z48813 Encounter for surgical aftercare following surgery on the respiratory system: Secondary | ICD-10-CM | POA: Diagnosis not present

## 2019-11-21 DIAGNOSIS — J320 Chronic maxillary sinusitis: Secondary | ICD-10-CM | POA: Diagnosis not present

## 2019-11-21 DIAGNOSIS — J019 Acute sinusitis, unspecified: Secondary | ICD-10-CM | POA: Diagnosis not present

## 2019-11-21 DIAGNOSIS — J330 Polyp of nasal cavity: Secondary | ICD-10-CM | POA: Diagnosis not present

## 2020-01-01 ENCOUNTER — Encounter: Payer: Self-pay | Admitting: *Deleted

## 2020-01-02 IMAGING — CT CT HEART MORPH/PULM VEIN W/ CM & W/O CA SCORE
2 of 9 series · 6 of 20 positions shown, 7 images · IV contrast (APPLIED)
Comparison: None
COMPARISON: None.
COMPARISON: None

Addendum:
CLINICAL DATA: Pre Ablation

EXAM:
Cardiac Gated CTA
TECHNIQUE: The patient was scanned on a Siemens Force [REDACTED]ice scanner. Gantry
rotation speed was 250 msec with a temporal resolution of 66 msec. A
prospective scan was triggered in the ascending thoracic aorta at
140 HU's Data sets were reconstructed with full mA between 35% and
75% of the R-R interval Images were reviewed using VRT, MIP and MPR
modes. Double oblique images were used to measure the PV diameter
and areas. The patient received 80 cc of contrast at 5 cc/sec
CONTRAST:  Isovue 370 total 80 cc

[Series 9: best diast · axial · 0.41mm/px · z∈[-291,-234]mm · 3 of 283 slices shown, 4 images]
[im 71/283  vessel]
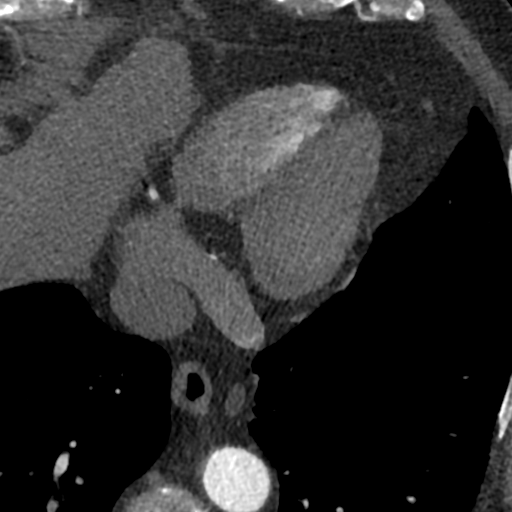
[im 71/283  lung]
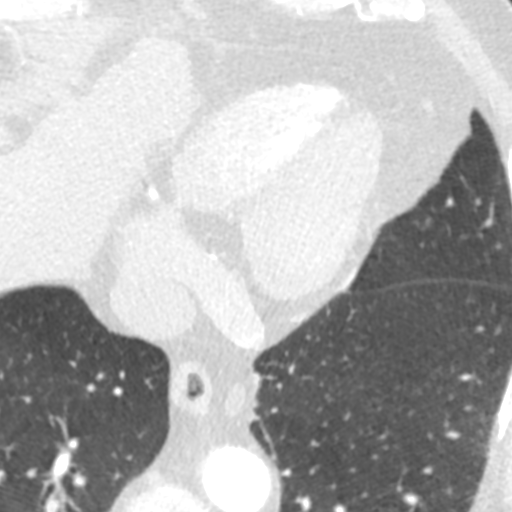
[im 142/283  vessel]
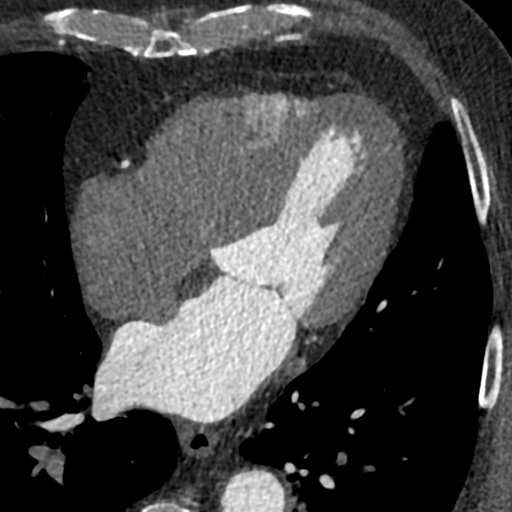
[im 212/283  vessel]
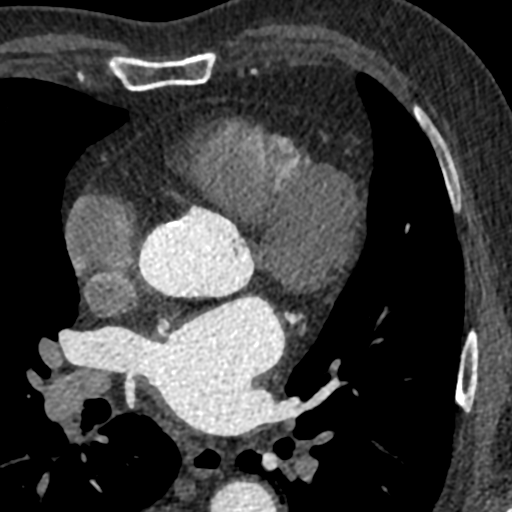

[Series 10: +300 ms · axial · 0.41mm/px · z∈[-291,-234]mm · 3 of 283 slices shown]
[im 71/283  vessel]
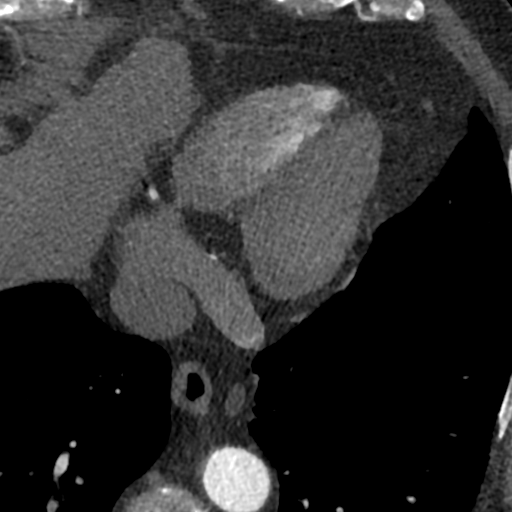
[im 142/283  vessel]
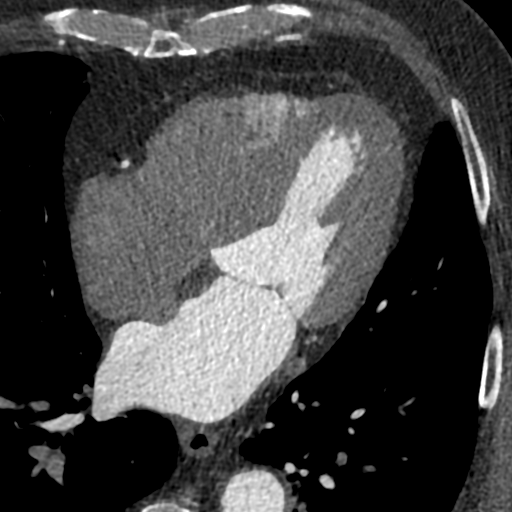
[im 212/283  vessel]
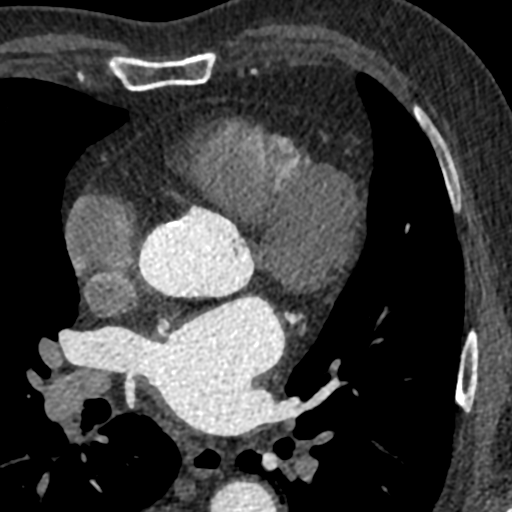

[6 of 20 positions shown; findings below may reference images not displayed]

FINDINGS: Normal atrial size. No ASD/PFO. Appears to be a thrombus at the tip
of the KIEFER. This does not totally clear on delayed images. Mildly
dilated aortic root 3.8 cm Normal PV anatomy with right middle
pulmonary vein

LUPV:  Ostium 16.4 mm   area 2.37 cm2

LLPV:   Ostium 14.1 mm  area 1.98 cm2

RUPV:  Ostium 14.5 mm  area 2.75 cm2

RLPV:  Ostium 19.4 mm  area 3.28 cm2

Coronary Arteries: Patient in afib during study unable to assess for
stenosis

Calcium Score: Calcium noted in proximal RCA/circumflex/LAD as well
as mid RCA
IMPRESSION: 1. Possible KIEFER thrombus in tip of appendage on both normal and
delayed contrast images. Suggest [place] prior to any ablation

2.  Mild aortic root dilatation 3.8 cm

3.  Normal PV anatomy measurements above including a right middle PV

4.  No pericardial effusion

5.  No ASD/PFO

6. Coronary calcium score 308 which is 58 th percentile for age and
sex

Findings discussed with patients primary cardiologist Kouros Kiefer

EXAM:
OVER-READ INTERPRETATION  CT CHEST

The following report is an over-read performed by radiologist Dr.
does not include interpretation of cardiac or coronary anatomy or
pathology. The coronary calcium score/coronary CTA interpretation by
the cardiologist is attached.
FINDINGS: The visualized portions of the lower lung fields show no suspicious
nodules, masses, or infiltrates. No pleural fluid seen.

The visualized portions of the mediastinum and chest wall are
unremarkable.
IMPRESSION: No significant non-cardiovascular abnormality seen in visualized
portion of the thorax.

*** End of Addendum ***
FINDINGS: Normal atrial size. No ASD/PFO. Appears to be a thrombus at the tip
of the KIEFER. This does not totally clear on delayed images. Mildly
dilated aortic root 3.8 cm Normal PV anatomy with right middle
pulmonary vein

LUPV:  Ostium 16.4 mm   area 2.37 cm2

LLPV:   Ostium 14.1 mm  area 1.98 cm2

RUPV:  Ostium 14.5 mm  area 2.75 cm2

RLPV:  Ostium 19.4 mm  area 3.28 cm2

Coronary Arteries: Patient in afib during study unable to assess for
stenosis

Calcium Score: Calcium noted in proximal RCA/circumflex/LAD as well
as mid RCA
IMPRESSION: 1. Possible KIEFER thrombus in tip of appendage on both normal and
delayed contrast images. Suggest [place] prior to any ablation

2.  Mild aortic root dilatation 3.8 cm

3.  Normal PV anatomy measurements above including a right middle PV

4.  No pericardial effusion

5.  No ASD/PFO

6. Coronary calcium score 308 which is 58 th percentile for age and
sex

Findings discussed with patients primary cardiologist Kouros Kiefer

## 2020-01-13 ENCOUNTER — Encounter: Payer: Self-pay | Admitting: Internal Medicine

## 2020-01-13 ENCOUNTER — Ambulatory Visit: Payer: Medicare HMO | Admitting: Internal Medicine

## 2020-01-13 ENCOUNTER — Other Ambulatory Visit: Payer: Self-pay

## 2020-01-13 VITALS — BP 150/88 | HR 92 | Ht 70.5 in | Wt 254.0 lb

## 2020-01-13 DIAGNOSIS — I4819 Other persistent atrial fibrillation: Secondary | ICD-10-CM | POA: Diagnosis not present

## 2020-01-13 DIAGNOSIS — I10 Essential (primary) hypertension: Secondary | ICD-10-CM | POA: Diagnosis not present

## 2020-01-13 MED ORDER — LISINOPRIL 20 MG PO TABS: 20.0000 mg | ORAL_TABLET | Freq: Every day | ORAL | 3 refills | Status: DC

## 2020-01-13 NOTE — Patient Instructions (Addendum)
Medication Instructions: \ Your physician has recommended you make the following change in your medication:  -- INCREASE Lisinopril to 20 mg - Take 1 tablet by mouth daily -- NEW RX SENT *If you need a refill on your cardiac medications before your next appointment, please call your pharmacy*  Follow-Up: At [facility], you and your health needs are our priority.  As part of our continuing mission to provide you with exceptional heart care, we have created designated Provider Care Teams.  These Care Teams include your primary Cardiologist (physician) and Advanced Practice Providers (APPs -  Physician Assistants and Nurse Practitioners) who all work together to provide you with the care you need, when you need it.  We recommend signing up for the patient portal called "MyChart".  Sign up information is provided on this After Visit Summary.  MyChart is used to connect with patients for Virtual Visits (Telemedicine).  Patients are able to view lab/test results, encounter notes, upcoming appointments, etc.  Non-urgent messages can be sent to your provider as well.   To learn more about what you can do with MyChart, go to [email or web address].    Your next appointment:   Your physician recommends that you schedule a follow-up appointment in: 6 MONTHS with the [facility]  Your physician wants you to follow-up in: 1 YEAR with [name]. You will receive a reminder letter in the mail two months in advance. If you don't receive a letter, please call our office to schedule the follow-up appointment.  The format for your next appointment:   In Person with Thompson Grayer, MD    [email or web address]  DASH Eating Plan DASH stands for Dietary Approaches to Stop Hypertension. The DASH eating plan is a healthy eating plan that has been shown to:  Reduce high blood pressure (hypertension).  Reduce your risk for type 2 diabetes, heart disease, and  stroke.  Help with weight loss. What are tips for following this plan? Reading food labels  Check food labels for the amount of salt (sodium) per serving. Choose foods with less than 5 percent of the Daily Value of sodium. Generally, foods with less than 300 milligrams (mg) of sodium per serving fit into this eating plan.  To find whole grains, look for the word "whole" as the first word in the ingredient list. Shopping  Buy products labeled as "low-sodium" or "no salt added."  Buy fresh foods. Avoid canned foods and pre-made or frozen meals. Cooking  Avoid adding salt when cooking. Use salt-free seasonings or herbs instead of table salt or sea salt. Check with your health care provider or pharmacist before using salt substitutes.  Do not fry foods. Cook foods using healthy methods such as baking, boiling, grilling, roasting, and broiling instead.  Cook with heart-healthy oils, such as olive, canola, avocado, soybean, or sunflower oil. Meal planning  Eat a balanced diet that includes: ? 4 or more servings of fruits and 4 or more servings of vegetables each day. Try to fill one-half of your plate with fruits and vegetables. ? 6-8 servings of whole grains each day. ? Less than 6 oz (170 g) of lean meat, poultry, or fish each day. A 3-oz (85-g) serving of meat is about the same size as a deck of cards. One egg equals 1 oz (28 g). ? 2-3 servings of low-fat dairy each day. One serving is 1 cup (237 mL). ? 1 serving of nuts, seeds, or beans 5 times each week. ? 2-3 servings of heart-healthy fats.  Healthy fats called omega-3 fatty acids are found in foods such as walnuts, flaxseeds, fortified milks, and eggs. These fats are also found in cold-water fish, such as sardines, salmon, and mackerel.  Limit how much you eat of: ? Canned or prepackaged foods. ? Food that is high in trans fat, such as some fried foods. ? Food that is high in saturated fat, such as fatty meat. ? Desserts and other  sweets, sugary drinks, and other foods with added sugar. ? Full-fat dairy products.  Do not salt foods before eating.  Do not eat more than 4 egg yolks a week.  Try to eat at least 2 vegetarian meals a week.  Eat more home-cooked food and less restaurant, buffet, and fast food.   Lifestyle  When eating at a restaurant, ask that your food be prepared with less salt or no salt, if possible.  If you drink alcohol: ? Limit how much you use to:  0-1 drink a day for women who are not pregnant.  0-2 drinks a day for men. ? Be aware of how much alcohol is in your drink. In the [place] one drink equals one 12 oz bottle of beer (355 mL), one 5 oz glass of wine (148 mL), or one 1 oz glass of hard liquor (44 mL). General information  Avoid eating more than 2,300 mg of salt a day. If you have hypertension, you may need to reduce your sodium intake to 1,500 mg a day.  Work with your health care provider to maintain a healthy body weight or to lose weight. Ask what an ideal weight is for you.  Get at least 30 minutes of exercise that causes your heart to beat faster (aerobic exercise) most days of the week. Activities may include walking, swimming, or biking.  Work with your health care provider or dietitian to adjust your eating plan to your individual calorie needs. What foods should I eat? Fruits All fresh, dried, or frozen fruit. Canned fruit in natural juice (without added sugar). Vegetables Fresh or frozen vegetables (raw, steamed, roasted, or grilled). Low-sodium or reduced-sodium tomato and vegetable juice. Low-sodium or reduced-sodium tomato sauce and tomato paste. Low-sodium or reduced-sodium canned vegetables. Grains Whole-grain or whole-wheat bread. Whole-grain or whole-wheat pasta. Brown rice. [place]. Bulgur. Whole-grain and low-sodium cereals. Pita bread. Low-fat, low-sodium crackers. Whole-wheat flour tortillas. Meats and other proteins Skinless chicken or [place]. Ground  chicken or [place]. Pork with fat trimmed off. Fish and seafood. Egg whites. Dried beans, peas, or lentils. Unsalted nuts, nut butters, and seeds. Unsalted canned beans. Lean cuts of beef with fat trimmed off. Low-sodium, lean precooked or cured meat, such as sausages or meat loaves. Dairy Low-fat (1%) or fat-free (skim) milk. Reduced-fat, low-fat, or fat-free cheeses. Nonfat, low-sodium ricotta or cottage cheese. Low-fat or nonfat yogurt. Low-fat, low-sodium cheese. Fats and oils Soft margarine without trans fats. Vegetable oil. Reduced-fat, low-fat, or light mayonnaise and salad dressings (reduced-sodium). Canola, safflower, olive, avocado, soybean, and sunflower oils. Avocado. Seasonings and condiments Herbs. Spices. Seasoning mixes without salt. Other foods Unsalted popcorn and pretzels. Fat-free sweets. The items listed above may not be a complete list of foods and beverages you can eat. Contact a dietitian for more information. What foods should I avoid? Fruits Canned fruit in a light or heavy syrup. Fried fruit. Fruit in cream or butter sauce. Vegetables Creamed or fried vegetables. Vegetables in a cheese sauce. Regular canned vegetables (not low-sodium or reduced-sodium). Regular canned tomato sauce and paste (not low-sodium  or reduced-sodium). Regular tomato and vegetable juice (not low-sodium or reduced-sodium). Angie Fava. Olives. Grains Baked goods made with fat, such as croissants, muffins, or some breads. Dry pasta or rice meal packs. Meats and other proteins Fatty cuts of meat. Ribs. Fried meat. Berniece Salines. Bologna, salami, and other precooked or cured meats, such as sausages or meat loaves. Fat from the back of a pig (fatback). Bratwurst. Salted nuts and seeds. Canned beans with added salt. Canned or smoked fish. Whole eggs or egg yolks. Chicken or [place] with skin. Dairy Whole or 2% milk, cream, and half-and-half. Whole or full-fat cream cheese. Whole-fat or sweetened yogurt. Full-fat  cheese. Nondairy creamers. Whipped toppings. Processed cheese and cheese spreads. Fats and oils Butter. Stick margarine. Lard. Shortening. Ghee. Bacon fat. Tropical oils, such as coconut, palm kernel, or palm oil. Seasonings and condiments Onion salt, garlic salt, seasoned salt, table salt, and sea salt. Worcestershire sauce. Tartar sauce. Barbecue sauce. Teriyaki sauce. Soy sauce, including reduced-sodium. Steak sauce. Canned and packaged gravies. Fish sauce. Oyster sauce. Cocktail sauce. Store-bought horseradish. Ketchup. Mustard. Meat flavorings and tenderizers. Bouillon cubes. Hot sauces. Pre-made or packaged marinades. Pre-made or packaged taco seasonings. Relishes. Regular salad dressings. Other foods Salted popcorn and pretzels. The items listed above may not be a complete list of foods and beverages you should avoid. Contact a dietitian for more information. Where to find more information  National Heart, Lung, and Blood Institute: https://wilson-eaton.com/  American Heart Association: www.heart.org  Academy of Nutrition and Dietetics: www.eatright.[place]: www.kidney.org Summary  The DASH eating plan is a healthy eating plan that has been shown to reduce high blood pressure (hypertension). It may also reduce your risk for type 2 diabetes, heart disease, and stroke.  When on the DASH eating plan, aim to eat more fresh fruits and vegetables, whole grains, lean proteins, low-fat dairy, and heart-healthy fats.  With the DASH eating plan, you should limit salt (sodium) intake to 2,300 mg a day. If you have hypertension, you may need to reduce your sodium intake to 1,500 mg a day.  Work with your health care provider or dietitian to adjust your eating plan to your individual calorie needs. This information is not intended to replace advice given to you by your health care provider. Make sure you discuss any questions you have with your health care provider. Document  Revised: 11/23/2018 Document Reviewed: 11/23/2018 Elsevier Patient Education  2021 [employer].

## 2020-01-13 NOTE — Progress Notes (Signed)
   PCP: Jearld Fenton, NP   Primary EP: Kent James is a 75 y.o. male who presents today for routine electrophysiology followup.  Since last being seen in our clinic, the patient reports doing very well.  Today, he denies symptoms of palpitations, chest pain, shortness of breath,  lower extremity edema, dizziness, presyncope, or syncope.  The patient is otherwise without complaint today.   Past Medical History:  Diagnosis Date  . Childhood asthma   . Essential hypertension   . GERD (gastroesophageal reflux disease)   . History of chicken pox   . History of colon polyps   . Hyperlipidemia   . Persistent atrial fibrillation ([place])   . Snoring    Past Surgical History:  Procedure Laterality Date  . ATRIAL FIBRILLATION ABLATION N/A 12/11/2018   Procedure: ATRIAL FIBRILLATION ABLATION;  Surgeon: Kent Grayer, MD;  Location: [facility];  Service: Cardiovascular;  Laterality: N/A;  . CARDIOVERSION    . IMAGE GUIDED SINUS SURGERY N/A 10/29/2019   Procedure: IMAGE GUIDED SINUS SURGERY;  Surgeon: Carloyn Manner, MD;  Location: [facility];  Service: ENT;  Laterality: N/A;  . MAXILLARY ANTROSTOMY Right 10/29/2019   Procedure: MAXILLARY ANTROSTOMY;  Surgeon: Carloyn Manner, MD;  Location: [facility];  Service: ENT;  Laterality: Right;  . TEE WITHOUT CARDIOVERSION N/A 12/11/2018   Procedure: TRANSESOPHAGEAL ECHOCARDIOGRAM (TEE);  Surgeon: Elouise Munroe, MD;  Location: [facility];  Service: Cardiology;  Laterality: N/A;    ROS- all systems are reviewed and negatives except as per HPI above  Current Outpatient Medications  Medication Sig Dispense Refill  . ELIQUIS 5 MG TABS tablet TAKE 1 TABLET BY MOUTH TWICE A DAY 60 tablet 5  . lisinopril (ZESTRIL) 10 MG tablet TAKE 1 TABLET BY MOUTH EVERY DAY (Patient taking differently: Take 10 mg by mouth daily.) 90 tablet 3  . omeprazole (PRILOSEC) 20 MG capsule Take 20 mg by mouth daily.     No current  facility-administered medications for this visit.    Physical Exam: Vitals:   01/13/20 1046  BP: (!) 150/88  Pulse: 92  SpO2: 97%  Weight: 254 lb (115.2 kg)  Height: 5' 10.5" (1.791 m)    GEN- The patient is well appearing, alert and oriented x 3 today.   Head- normocephalic, atraumatic Eyes-  Sclera clear, conjunctiva pink Ears- hearing intact Oropharynx- clear Lungs- Clear to ausculation bilaterally, normal work of breathing Heart- Regular rate and rhythm, no murmurs, rubs or gallops, PMI not laterally displaced GI- soft, NT, ND, + BS Extremities- no clubbing, cyanosis, or edema  Wt Readings from Last 3 Encounters:  01/13/20 254 lb (115.2 kg)  10/29/19 252 lb 10.4 oz (114.6 kg)  10/15/19 252 lb 9.6 oz (114.6 kg)   Assessment and Plan:  1. Persistent atrial fibrillation Doing very well post ablation off AAD therapy chads2vasc scoreis 2.  Continue eliquis  2. Obesity lifesyle modification advised  3. HTN Stable Elevated Increase lisinopril to 20mg  daily 2 gram sodium restriction   Risks, benefits and potential toxicities for medications prescribed and/or refilled reviewed with patient today.   AF clinic in 6 months for further lifestyle discussions Return in a year to see me  Kent Grayer MD, [facility] 01/13/2020 11:31 AM

## 2020-01-23 DIAGNOSIS — D140 Benign neoplasm of middle ear, nasal cavity and accessory sinuses: Secondary | ICD-10-CM | POA: Diagnosis not present

## 2020-01-23 DIAGNOSIS — J320 Chronic maxillary sinusitis: Secondary | ICD-10-CM | POA: Diagnosis not present

## 2020-04-06 DIAGNOSIS — H26493 Other secondary cataract, bilateral: Secondary | ICD-10-CM | POA: Diagnosis not present

## 2020-04-06 DIAGNOSIS — H04123 Dry eye syndrome of bilateral lacrimal glands: Secondary | ICD-10-CM | POA: Diagnosis not present

## 2020-04-06 DIAGNOSIS — Z961 Presence of intraocular lens: Secondary | ICD-10-CM | POA: Diagnosis not present

## 2020-04-08 DIAGNOSIS — H26493 Other secondary cataract, bilateral: Secondary | ICD-10-CM | POA: Diagnosis not present

## 2020-04-08 DIAGNOSIS — H5203 Hypermetropia, bilateral: Secondary | ICD-10-CM | POA: Diagnosis not present

## 2020-04-10 DIAGNOSIS — D140 Benign neoplasm of middle ear, nasal cavity and accessory sinuses: Secondary | ICD-10-CM | POA: Diagnosis not present

## 2020-04-10 DIAGNOSIS — J320 Chronic maxillary sinusitis: Secondary | ICD-10-CM | POA: Diagnosis not present

## 2020-04-28 NOTE — Progress Notes (Signed)
Primary Care Physician: Jearld Carly Sabo, NP Primary Cardiologist: Dr Gardiner Rhyme Primary Electrophysiologist: Dr Rayann Heman Referring Physician: Dr Lorrene Reid Kent James is a 75 y.o. male with a history of paroxysmal atrial fibrillation and HTN who presents for follow up in the [facility].  The patient was initially diagnosed with atrial fibrillation in 2018 after presenting with symptoms of shortness of breath and an irregular pulse. He underwent two DCCV in 2018 and was started on sotalol. He had done well until 10/23/18 when he again had afib symptoms with heart rates in the 170s. He presented to the ER but converted to SR before being seen. He was seen by Dr Gardiner Rhyme and started on anticoagulation. He is on Eliquis for a CHADS2VASC score of 2. Patient does admit to snoring and witnessed apnea and a sleep study has been ordered. Patient reports that 11/05/18 he woke up in the middle of the night and felt he was back out of rhythm. He does have symptoms of fatigue with exertion and palpitations. Patient is s/p afib ablation on 12/11/18 with Dr Rayann Heman. He did well post ablation and his sotalol was discontinued.   On follow up today, patient reports he has done well since his last visit. He denies any episodes of heart racing since his ablation. He denies any bleeding issues on anticoagulation.   Today, he denies symptoms of palpitations, chest pain, shortness of breath, orthopnea, PND, lower extremity edema, dizziness, presyncope, syncope, bleeding, or neurologic sequela. The patient is tolerating medications without difficulties and is otherwise without complaint today.    Atrial Fibrillation Risk Factors:  he does not have symptoms or diagnosis of sleep apnea. Neg sleep study. he does not have a history of rheumatic fever. he does not have a history of alcohol use. The patient does not have a history of early familial atrial fibrillation or other arrhythmias.  he  has a BMI of Body mass index is 35.25 kg/m.Marland Kitchen Filed Weights   04/29/20 1032  Weight: 113 kg    Family History  Problem Relation Age of Onset  . Heart attack Father   . Breast cancer Sister   . Arthritis Maternal Grandmother      Atrial Fibrillation Management history:  Previous antiarrhythmic drugs: sotalol Previous cardioversions: 2018 x2 Previous ablations: 12/11/18 CHADS2VASC score: 2 Anticoagulation history: Eliquis   Past Medical History:  Diagnosis Date  . Childhood asthma   . Essential hypertension   . GERD (gastroesophageal reflux disease)   . History of chicken pox   . History of colon polyps   . Hyperlipidemia   . Persistent atrial fibrillation ([place])   . Snoring    Past Surgical History:  Procedure Laterality Date  . ATRIAL FIBRILLATION ABLATION N/A 12/11/2018   Procedure: ATRIAL FIBRILLATION ABLATION;  Surgeon: Thompson Grayer, MD;  Location: [facility];  Service: Cardiovascular;  Laterality: N/A;  . CARDIOVERSION    . IMAGE GUIDED SINUS SURGERY N/A 10/29/2019   Procedure: IMAGE GUIDED SINUS SURGERY;  Surgeon: Carloyn Manner, MD;  Location: [facility];  Service: ENT;  Laterality: N/A;  . MAXILLARY ANTROSTOMY Right 10/29/2019   Procedure: MAXILLARY ANTROSTOMY;  Surgeon: Carloyn Manner, MD;  Location: [facility];  Service: ENT;  Laterality: Right;  . TEE WITHOUT CARDIOVERSION N/A 12/11/2018   Procedure: TRANSESOPHAGEAL ECHOCARDIOGRAM (TEE);  Surgeon: Elouise Munroe, MD;  Location: [facility];  Service: Cardiology;  Laterality: N/A;    Current Outpatient Medications  Medication Sig Dispense Refill  .  [facility] NASAL ALLERGY SPRAY 55 MCG/ACT AERO nasal inhaler SMARTSIG:2 Puff(s) Both Nares Daily    . ELIQUIS 5 MG TABS tablet TAKE 1 TABLET BY MOUTH TWICE A DAY 60 tablet 5  . lisinopril (ZESTRIL) 20 MG tablet Take 1 tablet (20 mg total) by mouth daily. 90 tablet 3  . omeprazole (PRILOSEC) 20 MG capsule Take 20 mg by mouth daily.     No current  facility-administered medications for this encounter.    No Known Allergies  Social History   Socioeconomic History  . Marital status: Married    Spouse name: Not on file  . Number of children: Not on file  . Years of education: Not on file  . Highest education level: Not on file  Occupational History  . Not on file  Tobacco Use  . Smoking status: Former Smoker    Start date: 03/14/2017  . Smokeless tobacco: Never Used  Vaping Use  . Vaping Use: Never used  Substance and Sexual Activity  . Alcohol use: Yes    Alcohol/week: 2.0 standard drinks    Types: 2 Standard drinks or equivalent per week    Comment: couple daily  . Drug use: No  . Sexual activity: Not on file  Other Topics Concern  . Not on file  Social History Narrative   Lives in [place] with spouse.   Retired Hotel manager   Social Determinants of Radio broadcast assistant Strain: Not on [employer] Insecurity: Not on file  Transportation Needs: Not on file  Physical Activity: Not on file  Stress: Not on file  Social Connections: Not on file  Intimate Partner Violence: Not on file     ROS- All systems are reviewed and negative except as per the HPI above.  Physical Exam: Vitals:   04/29/20 1032  BP: (!) 142/78  Pulse: 82  Weight: 113 kg  Height: 5' 10.5" (1.791 m)    GEN- The patient is a well appearing obese elderly male, alert and oriented x 3 today.   HEENT-head normocephalic, atraumatic, sclera clear, conjunctiva pink, hearing intact, trachea midline. Lungs- Clear to ausculation bilaterally, normal work of breathing Heart- Regular rate and rhythm, no murmurs, rubs or gallops  GI- soft, NT, ND, + BS Extremities- no clubbing, cyanosis, or edema MS- no significant deformity or atrophy Skin- no rash or lesion Psych- euthymic mood, full affect Neuro- strength and sensation are intact   Wt Readings from Last 3 Encounters:  04/29/20 113 kg  01/13/20 115.2 kg  10/29/19 114.6 kg    EKG today  demonstrates  SR Vent. rate 82 BPM PR interval 180 ms QRS duration 84 ms QT/QTcB 374/436 ms  Echo 11/12/18 1. Left ventricular ejection fraction, by visual estimation, is 65 to 70%. The left ventricle has hyperdynamic function. There is moderately increased left ventricular hypertrophy.  2. Global right ventricle has normal systolic function.The right ventricular size is normal. No increase in right ventricular wall thickness.  3. Left atrial size was normal.  4. Right atrial size was normal.  5. The mitral valve is normal in structure. No evidence of mitral valve regurgitation.  6. The tricuspid valve is normal in structure. Tricuspid valve regurgitation is trivial.  7. The aortic valve is tricuspid. Aortic valve regurgitation is mild. Mild aortic valve sclerosis without stenosis.  8. The pulmonic valve was grossly normal. Pulmonic valve regurgitation is not visualized.   Epic records are reviewed at length today  Assessment and Plan:  1. Paroxysmal atrial fibrillation  S/p afib ablation 12/11/18 Patient appears to be maintaining SR. Continue Eliquis 5 mg BID Continue diltiazem 30 mg PRN q4hrs for heart rate >100bpm.  This patients CHA2DS2-VASc Score and unadjusted Ischemic Stroke Rate (% per year) is equal to 2.2 % stroke rate/year from a score of 2  Above score calculated as 1 point each if present [CHF, HTN, DM, Vascular=MI/PAD/Aortic Plaque, Age if 65-74, or Male] Above score calculated as 2 points each if present [Age > 75, or Stroke/TIA/TE]  2. Obesity Body mass index is 35.25 kg/m. Lifestyle modification was discussed and encouraged including regular physical activity and weight reduction.  3. HTN Stable, no changes today.   Follow up with Dr Rayann Heman per recall. AF clinic in one year.    [facility] [address] [place], [place] [address] [phone] 04/29/2020 10:59 AM

## 2020-04-29 ENCOUNTER — Other Ambulatory Visit: Payer: Self-pay

## 2020-04-29 ENCOUNTER — Ambulatory Visit (HOSPITAL_COMMUNITY)
Admission: RE | Admit: 2020-04-29 | Discharge: 2020-04-29 | Disposition: A | Discharge status: Home / Self Care | Payer: Medicare HMO | Source: Ambulatory Visit | Attending: Physician Assistant | Admitting: Physician Assistant

## 2020-04-29 ENCOUNTER — Encounter (HOSPITAL_COMMUNITY): Payer: Self-pay | Admitting: Physician Assistant

## 2020-04-29 VITALS — BP 142/78 | HR 82 | Ht 70.5 in | Wt 249.2 lb

## 2020-04-29 DIAGNOSIS — D6869 Other thrombophilia: Secondary | ICD-10-CM

## 2020-04-29 DIAGNOSIS — E669 Obesity, unspecified: Secondary | ICD-10-CM | POA: Insufficient documentation

## 2020-04-29 DIAGNOSIS — I10 Essential (primary) hypertension: Secondary | ICD-10-CM | POA: Diagnosis not present

## 2020-04-29 DIAGNOSIS — I480 Paroxysmal atrial fibrillation: Secondary | ICD-10-CM | POA: Diagnosis not present

## 2020-04-29 DIAGNOSIS — Z6835 Body mass index (BMI) 35.0-35.9, adult: Secondary | ICD-10-CM | POA: Diagnosis not present

## 2020-04-29 DIAGNOSIS — Z87891 Personal history of nicotine dependence: Secondary | ICD-10-CM | POA: Diagnosis not present

## 2020-04-29 DIAGNOSIS — Z7901 Long term (current) use of anticoagulants: Secondary | ICD-10-CM | POA: Insufficient documentation

## 2020-05-04 DIAGNOSIS — H524 Presbyopia: Secondary | ICD-10-CM | POA: Diagnosis not present

## 2020-05-04 DIAGNOSIS — H52223 Regular astigmatism, bilateral: Secondary | ICD-10-CM | POA: Diagnosis not present

## 2020-05-14 ENCOUNTER — Other Ambulatory Visit: Payer: Self-pay | Admitting: Cardiology

## 2020-05-14 NOTE — Telephone Encounter (Signed)
50m, 115.2kg, scr 1.10 10/25/19, lovw/fenton 04/29/20

## 2020-09-11 DIAGNOSIS — D140 Benign neoplasm of middle ear, nasal cavity and accessory sinuses: Secondary | ICD-10-CM | POA: Diagnosis not present

## 2020-09-11 DIAGNOSIS — J320 Chronic maxillary sinusitis: Secondary | ICD-10-CM | POA: Diagnosis not present

## 2020-10-12 DIAGNOSIS — Z961 Presence of intraocular lens: Secondary | ICD-10-CM | POA: Diagnosis not present

## 2020-10-12 DIAGNOSIS — H04123 Dry eye syndrome of bilateral lacrimal glands: Secondary | ICD-10-CM | POA: Diagnosis not present

## 2020-10-12 DIAGNOSIS — H26492 Other secondary cataract, left eye: Secondary | ICD-10-CM | POA: Diagnosis not present

## 2020-10-12 DIAGNOSIS — H26493 Other secondary cataract, bilateral: Secondary | ICD-10-CM | POA: Diagnosis not present

## 2020-11-11 ENCOUNTER — Other Ambulatory Visit: Payer: Self-pay | Admitting: Internal Medicine

## 2020-11-11 DIAGNOSIS — I480 Paroxysmal atrial fibrillation: Secondary | ICD-10-CM

## 2020-11-11 MED ORDER — APIXABAN 5 MG PO TABS
5.0000 mg | ORAL_TABLET | Freq: Two times a day (BID) | ORAL | 0 refills | Status: DC
Start: 2020-11-11 — End: 2021-02-08

## 2020-11-11 NOTE — Telephone Encounter (Signed)
*  STAT* If patient is at the pharmacy, call can be transferred to refill team.   1. Which medications need to be refilled? (please list name of each medication and dose if known) ELIQUIS 5 MG TABS tablet  2. Which pharmacy/location (including street and city if local pharmacy) is medication to be sent to? [id number] - [place], [place] - [address]  3. Do they need a 30 day or 90 day supply?  90 day supply     Kent James is calling requesting this refill. He leaves tomorrow so he needs this today, so he can pick it up in the morning.

## 2020-11-11 NOTE — Telephone Encounter (Signed)
Spoke to pt and he stated that he is going out of town and needs his refill of Eliquis. Pt is going to come in on 11/18 for blood work. Appointment made and orders placed.   Refill sent.

## 2020-11-11 NOTE — Telephone Encounter (Signed)
Prescription refill request for Eliquis received.  Indication: afib  Last office visit: Fenton, 04/29/2020 Scr: 1.10, 10/25/2019 Age: 75 yo  Weight: 113 kg   Pt is overdue for blood work. Called pt to let him know. Unable to get in touch with patient.

## 2020-11-20 ENCOUNTER — Other Ambulatory Visit: Payer: Medicare HMO | Admitting: *Deleted

## 2020-11-20 ENCOUNTER — Other Ambulatory Visit: Payer: Self-pay

## 2020-11-20 DIAGNOSIS — I480 Paroxysmal atrial fibrillation: Secondary | ICD-10-CM

## 2020-11-20 LAB — BASIC METABOLIC PANEL
BUN/Creatinine Ratio: 11 (ref 10–24)
BUN: 10 mg/dL (ref 8–27)
CO2: 26 mmol/L (ref 20–29)
Calcium: 8.7 mg/dL (ref 8.6–10.2)
Chloride: 103 mmol/L (ref 96–106)
Creatinine, Ser: 0.94 mg/dL (ref 0.76–1.27)
Glucose: 106 mg/dL — ABNORMAL HIGH (ref 70–99)
Potassium: 4.5 mmol/L (ref 3.5–5.2)
Sodium: 142 mmol/L (ref 134–144)
eGFR: 85 mL/min/{1.73_m2} (ref >59)

## 2020-11-20 LAB — CBC
Hematocrit: 49 % (ref 37.5–51.0)
Hemoglobin: 16.9 g/dL (ref 13.0–17.7)
MCH: 31.2 pg (ref 26.6–33.0)
MCHC: 34.5 g/dL (ref 31.5–35.7)
MCV: 90 fL (ref 79–97)
Platelets: 177 10*3/uL (ref 150–450)
RBC: 5.42 x10E6/uL (ref 4.14–5.80)
RDW: 13.4 % (ref 11.6–15.4)
WBC: 5.9 10*3/uL (ref 3.4–10.8)

## 2021-01-01 ENCOUNTER — Other Ambulatory Visit: Payer: Self-pay | Admitting: Internal Medicine

## 2021-01-05 ENCOUNTER — Ambulatory Visit (INDEPENDENT_AMBULATORY_CARE_PROVIDER_SITE_OTHER): Payer: Medicare HMO | Admitting: Internal Medicine

## 2021-01-05 ENCOUNTER — Other Ambulatory Visit: Payer: Self-pay

## 2021-01-05 ENCOUNTER — Encounter: Payer: Self-pay | Admitting: Internal Medicine

## 2021-01-05 DIAGNOSIS — I10 Essential (primary) hypertension: Secondary | ICD-10-CM | POA: Diagnosis not present

## 2021-01-05 DIAGNOSIS — I480 Paroxysmal atrial fibrillation: Secondary | ICD-10-CM | POA: Diagnosis not present

## 2021-01-05 DIAGNOSIS — K219 Gastro-esophageal reflux disease without esophagitis: Secondary | ICD-10-CM | POA: Diagnosis not present

## 2021-01-05 DIAGNOSIS — E7800 Pure hypercholesterolemia, unspecified: Secondary | ICD-10-CM | POA: Diagnosis not present

## 2021-01-05 DIAGNOSIS — Z6836 Body mass index (BMI) 36.0-36.9, adult: Secondary | ICD-10-CM

## 2021-01-05 DIAGNOSIS — E6609 Other obesity due to excess calories: Secondary | ICD-10-CM | POA: Insufficient documentation

## 2021-01-05 NOTE — Assessment & Plan Note (Signed)
He declines lipid profile today Encouraged him to consume a low-fat diet

## 2021-01-05 NOTE — Progress Notes (Signed)
Subjective:    Patient ID: Kent James, male    DOB: Mar 28, 1945, 76 y.o.   MRN: [id number]  HPI  Patient presents to clinic today for his annual exam.  He is also due for follow-up of chronic conditions.  HTN: His BP today is 127/77.  He is taking Lisinopril as prescribed.  ECG from 04/2020 reviewed.  GERD: Triggered by tomato-based foods and alcohol.  He denies breakthrough on Omeprazole.  There is no upper GI on file.  HDL: His last LDL was 127, triglycerides 72, 03/2019.  He is not taking any cholesterol-lowering medications at this time.  He tries to consume a low-fat diet.  Afib: Status post ablation.  Managed on Eliquis.  He follows with cardiology.  ECG from 04/2020 reviewed.  Flu: 09/2020 Tetanus: Unsure Covid: Pfizer x 3 Shingrix:  never Pneumovax: 03/2015 Prevnar: 02/2014 PSA screening: 03/2019 Colon screening: 10 years ago Vision Screening: annually Dentist: as needed  Diet: He does eat meat. He consumes fruits and veggies. He tries to avoid fried foods. He drinks mostly soda. Exercise: Walking  Review of Systems     Past Medical History:  Diagnosis Date   Childhood asthma    Essential hypertension    GERD (gastroesophageal reflux disease)    History of chicken pox    History of colon polyps    Hyperlipidemia    Persistent atrial fibrillation (HCC)    Snoring     Current Outpatient Medications  Medication Sig Dispense Refill   apixaban (ELIQUIS) 5 MG TABS tablet Take 1 tablet (5 mg total) by mouth 2 (two) times daily. 180 tablet 0   [employer] NASAL ALLERGY SPRAY 55 MCG/ACT AERO nasal inhaler SMARTSIG:2 Puff(s) Both Nares Daily     lisinopril (ZESTRIL) 20 MG tablet TAKE 1 TABLET BY MOUTH EVERY DAY 90 tablet 3   omeprazole (PRILOSEC) 20 MG capsule Take 20 mg by mouth daily.     No current facility-administered medications for this visit.    No Known Allergies  Family History  Problem Relation Age of Onset   Heart attack Father    Breast cancer Sister     Arthritis Maternal Grandmother     Social History   Socioeconomic History   Marital status: Married    Spouse name: Not on file   Number of children: Not on file   Years of education: Not on file   Highest education level: Not on file  Occupational History   Not on file  Tobacco Use   Smoking status: Former    Types: Cigarettes    Start date: 03/14/2017   Smokeless tobacco: Never  Vaping Use   Vaping Use: Never used  Substance and Sexual Activity   Alcohol use: Yes    Alcohol/week: 2.0 standard drinks    Types: 2 Standard drinks or equivalent per week    Comment: couple daily   Drug use: No   Sexual activity: Not on file  Other Topics Concern   Not on file  Social History Narrative   Lives in [place] with spouse.   Retired Hotel manager   Social Determinants of Radio broadcast assistant Strain: Not on [employer] Insecurity: Not on file  Transportation Needs: Not on file  Physical Activity: Not on file  Stress: Not on file  Social Connections: Not on file  Intimate Partner Violence: Not on file     Constitutional: Denies fever, malaise, fatigue, headache or abrupt weight changes.  HEENT: Denies eye pain, eye redness,  ear pain, ringing in the ears, wax buildup, runny nose, nasal congestion, bloody nose, or sore throat. Respiratory: Denies difficulty breathing, shortness of breath, cough or sputum production.   Cardiovascular: Denies chest pain, chest tightness, palpitations or swelling in the hands or feet.  Gastrointestinal: Denies abdominal pain, bloating, constipation, diarrhea or blood in the stool.  GU: Denies urgency, frequency, pain with urination, burning sensation, blood in urine, odor or discharge. Musculoskeletal: Denies decrease in range of motion, difficulty with gait, muscle pain or joint pain and swelling.  Skin: Denies redness, rashes, lesions or ulcercations.  Neurological: Denies dizziness, difficulty with memory, difficulty with speech or problems  with balance and coordination.  Psych: Denies anxiety, depression, SI/HI.  No other specific complaints in a complete review of systems (except as listed in HPI above).  Objective:  BP 127/77 (BP Location: Right Arm, Patient Position: Sitting, Cuff Size: Normal)    Pulse 67    Temp 97.7 F (36.5 C) (Temporal)    Resp 17    Ht 5' 10.5" (1.791 m)    Wt 254 lb 9.6 oz (115.5 kg)    SpO2 98%    BMI 36.01 kg/m   Wt Readings from Last 3 Encounters:  04/29/20 249 lb 3.2 oz (113 kg)  01/13/20 254 lb (115.2 kg)  10/29/19 252 lb 10.4 oz (114.6 kg)    General: Appears his stated age, obese, in NAD. Skin: Warm, dry and intact.  HEENT: Head: normal shape and size; Eyes: sclera white and EOMs intact;  Neck:  Neck supple, trachea midline. No masses, lumps or thyromegaly present.  Cardiovascular: Normal rate and rhythm. S1,S2 noted.  No murmur, rubs or gallops noted. No JVD or BLE edema. No carotid bruits noted. Pulmonary/Chest: Normal effort and positive vesicular breath sounds. No respiratory distress. No wheezes, rales or ronchi noted.  Abdomen: Soft and nontender. Normal bowel sounds.  Ventral hernia noted. Liver, spleen and kidneys non palpable. Musculoskeletal: Strength 5/5 BUE/BLE.  No difficulty with gait.  Neurological: Alert and oriented. Cranial nerves II-XII grossly intact. Coordination normal.  Psychiatric: Mood and affect normal. Behavior is normal. Judgment and thought content normal.   BMET    Component Value Date/Time   NA 142 11/20/2020 1002   K 4.5 11/20/2020 1002   CL 103 11/20/2020 1002   CO2 26 11/20/2020 1002   GLUCOSE 106 (H) 11/20/2020 1002   GLUCOSE 121 (H) 10/25/2019 1056   BUN 10 11/20/2020 1002   CREATININE 0.94 11/20/2020 1002   CALCIUM 8.7 11/20/2020 1002   GFRNONAA >60 10/25/2019 1056   GFRAA >60 01/08/2019 1200    Lipid Panel     Component Value Date/Time   CHOL 177 03/19/2019 1116   TRIG 72.0 03/19/2019 1116   HDL 35.30 (L) 03/19/2019 1116   CHOLHDL 5  03/19/2019 1116   VLDL 14.4 03/19/2019 1116   LDLCALC 127 (H) 03/19/2019 1116    CBC    Component Value Date/Time   WBC 5.9 11/20/2020 1002   WBC 8.9 10/25/2019 1056   RBC 5.42 11/20/2020 1002   RBC 5.40 10/25/2019 1056   HGB 16.9 11/20/2020 1002   HCT 49.0 11/20/2020 1002   PLT 177 11/20/2020 1002   MCV 90 11/20/2020 1002   MCH 31.2 11/20/2020 1002   MCH 31.7 10/25/2019 1056   MCHC 34.5 11/20/2020 1002   MCHC 35.6 10/25/2019 1056   RDW 13.4 11/20/2020 1002   LYMPHSABS 1.2 11/23/2018 1050   EOSABS 0.3 11/23/2018 1050   BASOSABS 0.1 11/23/2018  1050    Hgb A1C No results found for: HGBA1C          Assessment & Plan:  Preventative Health Maintenance:  Flu shot UTD per his report Advised him if he gets bit or cut he will need to go get a tetanus vaccine Pneumovax and Prevnar UTD He is considering getting Shingrix vaccine, will check coverage with his insurance company He plans to call GI to schedule his own colonoscopy Encouraged him to consume a balanced diet and exercise regimen Advised him to see an eye doctor and dentist annully He declines labs today  RTC in 1 year, sooner if needed Webb Silversmith, NP This visit occurred during the SARS-CoV-2 public health emergency.  Safety protocols were in place, including screening questions prior to the visit, additional usage of staff PPE, and extensive cleaning of exam room while observing appropriate contact time as indicated for disinfecting solutions.

## 2021-01-05 NOTE — Assessment & Plan Note (Signed)
Encourage diet and exercise for weight loss 

## 2021-01-05 NOTE — Assessment & Plan Note (Signed)
Encouraged weight loss as this can help reduce reflux symptoms Continue Omeprazole 

## 2021-01-05 NOTE — Assessment & Plan Note (Signed)
No longer on beta-blocker Continue Apixaban He will continue to see cardiology

## 2021-01-05 NOTE — Patient Instructions (Signed)
Health Maintenance After Age 76 After age 76, you are at a higher risk for certain long-term diseases and infections as well as injuries from falls. Falls are a major cause of broken bones and head injuries in people who are older than age 76. Getting regular preventive care can help to keep you healthy and well. Preventive care includes getting regular testing and making lifestyle changes as recommended by your health care provider. Talk with your health care provider about: Which screenings and tests you should have. A screening is a test that checks for a disease when you have no symptoms. A diet and exercise plan that is right for you. What should I know about screenings and tests to prevent falls? Screening and testing are the best ways to find a health problem early. Early diagnosis and treatment give you the best chance of managing medical conditions that are common after age 76. Certain conditions and lifestyle choices may make you more likely to have a fall. Your health care provider may recommend: Regular vision checks. Poor vision and conditions such as cataracts can make you more likely to have a fall. If you wear glasses, make sure to get your prescription updated if your vision changes. Medicine review. Work with your health care provider to regularly review all of the medicines you are taking, including over-the-counter medicines. Ask your health care provider about any side effects that may make you more likely to have a fall. Tell your health care provider if any medicines that you take make you feel dizzy or sleepy. Strength and balance checks. Your health care provider may recommend certain tests to check your strength and balance while standing, walking, or changing positions. Foot health exam. Foot pain and numbness, as well as not wearing proper footwear, can make you more likely to have a fall. Screenings, including: Osteoporosis screening. Osteoporosis is a condition that causes  the bones to get weaker and break more easily. Blood pressure screening. Blood pressure changes and medicines to control blood pressure can make you feel dizzy. Depression screening. You may be more likely to have a fall if you have a fear of falling, feel depressed, or feel unable to do activities that you used to do. Alcohol use screening. Using too much alcohol can affect your balance and may make you more likely to have a fall. Follow these instructions at home: Lifestyle Do not drink alcohol if: Your health care provider tells you not to drink. If you drink alcohol: Limit how much you have to: 0-1 drink a day for women. 0-2 drinks a day for men. Know how much alcohol is in your drink. In the [place] one drink equals one 12 oz bottle of beer (355 mL), one 5 oz glass of wine (148 mL), or one 1 oz glass of hard liquor (44 mL). Do not use any products that contain nicotine or tobacco. These products include cigarettes, chewing tobacco, and vaping devices, such as e-cigarettes. If you need help quitting, ask your health care provider. Activity  Follow a regular exercise program to stay fit. This will help you maintain your balance. Ask your health care provider what types of exercise are appropriate for you. If you need a cane or walker, use it as recommended by your health care provider. Wear supportive shoes that have nonskid soles. Safety  Remove any tripping hazards, such as rugs, cords, and clutter. Install safety equipment such as grab bars in bathrooms and safety rails on stairs. Keep rooms and walkways   well-lit. General instructions Talk with your health care provider about your risks for falling. Tell your health care provider if: You fall. Be sure to tell your health care provider about all falls, even ones that seem minor. You feel dizzy, tiredness (fatigue), or off-balance. Take over-the-counter and prescription medicines only as told by your health care provider. These include  supplements. Eat a healthy diet and maintain a healthy weight. A healthy diet includes low-fat dairy products, low-fat (lean) meats, and fiber from whole grains, beans, and lots of fruits and vegetables. Stay current with your vaccines. Schedule regular health, dental, and eye exams. Summary Having a healthy lifestyle and getting preventive care can help to protect your health and wellness after age 76. Screening and testing are the best way to find a health problem early and help you avoid having a fall. Early diagnosis and treatment give you the best chance for managing medical conditions that are more common for people who are older than age 76. Falls are a major cause of broken bones and head injuries in people who are older than age 76. Take precautions to prevent a fall at home. Work with your health care provider to learn what changes you can make to improve your health and wellness and to prevent falls. This information is not intended to replace advice given to you by your health care provider. Make sure you discuss any questions you have with your health care provider. Document Revised: 05/11/2020 Document Reviewed: 05/11/2020 Elsevier Patient Education  2022 Elsevier Inc.  

## 2021-01-05 NOTE — Assessment & Plan Note (Signed)
Controlled on Lisinopril Reinforced DASH diet and exercise for weight loss Be met reviewed

## 2021-02-07 ENCOUNTER — Other Ambulatory Visit: Payer: Self-pay | Admitting: Internal Medicine

## 2021-02-07 DIAGNOSIS — I480 Paroxysmal atrial fibrillation: Secondary | ICD-10-CM

## 2021-02-08 NOTE — Telephone Encounter (Signed)
Eliquis 5mg  refill request received. Patient is 76 years old, weight-115.5kg, Crea-0.94 on 11/20/2020, Diagnosis-Afib, and last seen by Clint Fent on 04/29/2020 and pending an appt with [name] on 02/19/2021. Dose is appropriate based on dosing criteria. Will send in refill to requested pharmacy.

## 2021-02-19 ENCOUNTER — Ambulatory Visit: Payer: Medicare HMO | Admitting: Internal Medicine

## 2021-02-19 ENCOUNTER — Other Ambulatory Visit: Payer: Self-pay

## 2021-02-19 ENCOUNTER — Encounter: Payer: Self-pay | Admitting: Internal Medicine

## 2021-02-19 VITALS — BP 136/74 | HR 72 | Ht 70.5 in | Wt 254.6 lb

## 2021-02-19 DIAGNOSIS — Z6836 Body mass index (BMI) 36.0-36.9, adult: Secondary | ICD-10-CM | POA: Diagnosis not present

## 2021-02-19 DIAGNOSIS — I10 Essential (primary) hypertension: Secondary | ICD-10-CM | POA: Diagnosis not present

## 2021-02-19 DIAGNOSIS — I480 Paroxysmal atrial fibrillation: Secondary | ICD-10-CM | POA: Diagnosis not present

## 2021-02-19 NOTE — Progress Notes (Signed)
° °  PCP: Jearld Fenton, NP Primary Cardiologist: Dr Gardiner Rhyme Primary EP: Dr Chinita Pester Wherry is a 76 y.o. male who presents today for routine electrophysiology followup.  Since last being seen in our clinic, the patient reports doing very well.  Today, he denies symptoms of palpitations, chest pain, shortness of breath,  lower extremity edema, dizziness, presyncope, or syncope.  The patient is otherwise without complaint today.   Past Medical History:  Diagnosis Date   Childhood asthma    Essential hypertension    GERD (gastroesophageal reflux disease)    History of chicken pox    History of colon polyps    Hyperlipidemia    Persistent atrial fibrillation ([place])    Snoring    Past Surgical History:  Procedure Laterality Date   ATRIAL FIBRILLATION ABLATION N/A 12/11/2018   Procedure: ATRIAL FIBRILLATION ABLATION;  Surgeon: Thompson Grayer, MD;  Location: [facility];  Service: Cardiovascular;  Laterality: N/A;   CARDIOVERSION     IMAGE GUIDED SINUS SURGERY N/A 10/29/2019   Procedure: IMAGE GUIDED SINUS SURGERY;  Surgeon: Carloyn Manner, MD;  Location: [facility];  Service: ENT;  Laterality: N/A;   MAXILLARY ANTROSTOMY Right 10/29/2019   Procedure: MAXILLARY ANTROSTOMY;  Surgeon: Carloyn Manner, MD;  Location: [facility];  Service: ENT;  Laterality: Right;   TEE WITHOUT CARDIOVERSION N/A 12/11/2018   Procedure: TRANSESOPHAGEAL ECHOCARDIOGRAM (TEE);  Surgeon: Elouise Munroe, MD;  Location: [facility];  Service: Cardiology;  Laterality: N/A;    ROS- all systems are reviewed and negatives except as per HPI above  Current Outpatient Medications  Medication Sig Dispense Refill   ELIQUIS 5 MG TABS tablet TAKE 1 TABLET BY MOUTH TWICE A DAY 180 tablet 0   lisinopril (ZESTRIL) 20 MG tablet TAKE 1 TABLET BY MOUTH EVERY DAY 90 tablet 3   omeprazole (PRILOSEC) 20 MG capsule Take 20 mg by mouth daily.     No current facility-administered medications for this visit.     Physical Exam: Vitals:   02/19/21 1500  BP: 136/74  Pulse: 72  SpO2: 96%  Weight: 254 lb 9.6 oz (115.5 kg)  Height: 5' 10.5" (1.791 m)    GEN- The patient is well appearing, alert and oriented x 3 today.   Head- normocephalic, atraumatic Eyes-  Sclera clear, conjunctiva pink Ears- hearing intact Oropharynx- clear Lungs- Clear to ausculation bilaterally, normal work of breathing Heart- Regular rate and rhythm, no murmurs, rubs or gallops, PMI not laterally displaced GI- soft, NT, ND, + BS Extremities- no clubbing, cyanosis, or edema  Wt Readings from Last 3 Encounters:  02/19/21 254 lb 9.6 oz (115.5 kg)  01/05/21 254 lb 9.6 oz (115.5 kg)  04/29/20 249 lb 3.2 oz (113 kg)    EKG tracing ordered today is personally reviewed and shows sinus  Assessment and Plan:  Persistent atrial fibrillation Doing well post ablation off AAD therapy Chads2vasc score is 2.  He is on eliquis  2. HTN Stable No change required today  3. Obesity Body mass index is 36.01 kg/m. Lifestyle modification advised  Return in a year  Thompson Grayer MD, [facility] 02/19/2021 3:15 PM

## 2021-02-19 NOTE — Patient Instructions (Addendum)

## 2021-02-22 NOTE — Addendum Note (Signed)
Addended by: Jordan Likes on: 02/22/2021 08:57 AM   Modules accepted: Orders

## 2021-05-07 ENCOUNTER — Other Ambulatory Visit: Payer: Self-pay | Admitting: Internal Medicine

## 2021-05-07 DIAGNOSIS — I480 Paroxysmal atrial fibrillation: Secondary | ICD-10-CM

## 2021-05-07 NOTE — Telephone Encounter (Signed)
Prescription refill request for Eliquis received. ?Indication: Afib  ?Last office visit:02/19/21 ([place]) ?Scr:0.94 (11/20/20) ?Age: 76 ?Weight: 115.5kg ? ?Appropriate dose and refill sent to requested pharmacy.  ?

## 2021-05-24 ENCOUNTER — Telehealth: Payer: Self-pay

## 2021-05-24 NOTE — Telephone Encounter (Signed)
Left a message for patient to call back and schedule their Medicare Annual Wellness Visit (AWV) virtually, telephone or face to face. ?  ?Noretta Frier, CMA ?[phone]  ?

## 2021-08-06 DIAGNOSIS — D140 Benign neoplasm of middle ear, nasal cavity and accessory sinuses: Secondary | ICD-10-CM | POA: Diagnosis not present

## 2021-08-06 DIAGNOSIS — J320 Chronic maxillary sinusitis: Secondary | ICD-10-CM | POA: Diagnosis not present

## 2021-09-10 ENCOUNTER — Ambulatory Visit: Payer: Medicare HMO

## 2021-09-21 ENCOUNTER — Ambulatory Visit: Payer: Self-pay

## 2021-09-21 NOTE — Patient Outreach (Signed)
  Care Coordination   Initial Visit Note   09/21/2021 Name: Kent James MRN: [id number] DOB: 1945/09/11  Kent James is a 76 y.o. year old male who sees [name], [name], NP for primary care. I spoke with  [name] by phone today.  What matters to the patients health and wellness today?  Help with affording Eliquis when he is in the donut hole    Goals Addressed             This Visit's Progress    COMPLETED: RNCM: Effective Management of AFib       Care Coordination Interventions: Counseled on increased risk of stroke due to Afib and benefits of anticoagulation for stroke prevention Reviewed importance of adherence to anticoagulant exactly as prescribed Advised patient to discuss AFIB and heart health changes  with provider Counseled on bleeding risk associated with AFIB and importance of self-monitoring for signs/symptoms of bleeding Counseled on avoidance of NSAIDs due to increased bleeding risk with anticoagulants Counseled on importance of regular laboratory monitoring as prescribed Counseled on seeking medical attention after a head injury or if there is blood in the urine/stool Afib action plan reviewed Screening for signs and symptoms of depression related to chronic disease state  Assessed social determinant of health barriers Review of the care coordination program and the goals of the program. The patient states that his conditions are stable and he is doing well. Did ask about pharmacy assistance for help with obtaining Eliquis when he is in the donut hole as it cost 500.00 for 3 month supply versus 47.00 per month when he is not in the donut hole. Agrees to speak with pharm D. Will do a referral for pharmacy support. Instructed the patient on how to reach the [facility] for questions, concerns, or needs.            SDOH assessments and interventions completed:  Yes  SDOH Interventions Today    Flowsheet Row Most Recent Value  SDOH Interventions   Food  Insecurity Interventions Intervention Not Indicated  Housing Interventions Intervention Not Indicated  Transportation Interventions Intervention Not Indicated  Utilities Interventions Intervention Not Indicated  Financial Strain Interventions Other (Comment)  [medication cost for Eliquis when he is in the donut hole, pharmacy referral initiated]  Stress Interventions Intervention Not Indicated        Care Coordination Interventions Activated:  Yes  Care Coordination Interventions:  Yes, provided   Follow up plan: Referral made to pharmacy for support with medication cost concerns    Encounter Outcome:  Pt. Visit Completed   [name] RN, MSN, [place]  Mobile: [phone]

## 2021-09-21 NOTE — Patient Instructions (Signed)
Visit Information  Thank you for taking time to visit with me today. Please don't hesitate to contact me if I can be of assistance to you.   Following are the goals we discussed today:   Goals Addressed             This Visit's Progress    COMPLETED: RNCM: Effective Management of AFib       Care Coordination Interventions: Counseled on increased risk of stroke due to Afib and benefits of anticoagulation for stroke prevention Reviewed importance of adherence to anticoagulant exactly as prescribed Advised patient to discuss AFIB and heart health changes  with provider Counseled on bleeding risk associated with AFIB and importance of self-monitoring for signs/symptoms of bleeding Counseled on avoidance of NSAIDs due to increased bleeding risk with anticoagulants Counseled on importance of regular laboratory monitoring as prescribed Counseled on seeking medical attention after a head injury or if there is blood in the urine/stool Afib action plan reviewed Screening for signs and symptoms of depression related to chronic disease state  Assessed social determinant of health barriers Review of the care coordination program and the goals of the program. The patient states that his conditions are stable and he is doing well. Did ask about pharmacy assistance for help with obtaining Eliquis when he is in the donut hole as it cost 500.00 for 3 month supply versus 47.00 per month when he is not in the donut hole. Agrees to speak with pharm D. Will do a referral for pharmacy support. Instructed the patient on how to reach the [facility] for questions, concerns, or needs.              Please call the care guide team at [phone] if you need to  schedule an appointment.   If you are experiencing a Mental Health or [place] or need someone to talk to, please call the Suicide and Crisis Lifeline: 988 call the [place] National Suicide Prevention Lifeline: (316)319-3457 or TTY:  608-098-6564 TTY 445-406-4308) to talk to a trained counselor call 1-800-273-TALK (toll free, 24 hour hotline)  Patient verbalizes understanding of instructions and care plan provided today and agrees to view in [place]. Active MyChart status and patient understanding of how to access instructions and care plan via MyChart confirmed with patient.     A pharmacy referral placed today for help with medication cost associated with Eliquis. The patient knows to call the [facility] for new questions, concerns, or changes.   [name] RN, MSN, CCM Community Care Coordinator [place]  Mobile: [phone]    Apixaban Tablets What is this medication? APIXABAN (a PIX a ban) prevents or treats blood clots. It is also used to lower the risk of stroke in people with AFib (atrial fibrillation). It belongs to a group of medications called blood thinners. This medicine may be used for other purposes; ask your health care provider or pharmacist if you have questions. COMMON BRAND NAME(S): Eliquis What should I tell my care team before I take this medication? They need to know if you have any of these conditions: Antiphospholipid antibody syndrome Bleeding disorder History of bleeding in the brain History of blood clots History of stomach bleeding Kidney disease Liver disease Mechanical heart valve Spinal surgery An unusual or allergic reaction to apixaban, other medications, foods, dyes, or preservatives Pregnant or trying to get pregnant Breast-feeding How should I use this medication? Take this medication by mouth. For your therapy to work as well as possible, take each  dose exactly as prescribed on the prescription label. Do not skip doses. Skipping doses or stopping this medication can increase your risk of a blood clot or stroke. Keep taking this medication unless your care team tells you to stop. Take it as directed on the prescription label at the same time every day. You can take it with or  without food. If it upsets your stomach, take it with food. A special MedGuide will be given to you by the pharmacist with each prescription and refill. Be sure to read this information carefully each time. Talk to your care team about the use of this medication in children. Special care may be needed. Overdosage: If you think you have taken too much of this medicine contact a poison control center or emergency room at once. NOTE: This medicine is only for you. Do not share this medicine with others. What if I miss a dose? If you miss a dose, take it as soon as you can. If it is almost time for your next dose, take only that dose. Do not take double or extra doses. What may interact with this medication? This medication may interact with the following: Aspirin and aspirin-like medications Certain medications for fungal infections like itraconazole and ketoconazole Certain medications for seizures like carbamazepine and phenytoin Certain medications for blood clots like enoxaparin, dalteparin, heparin, and warfarin Clarithromycin NSAIDs, medications for pain and inflammation, like ibuprofen or naproxen Rifampin Ritonavir St. John's wort This list may not describe all possible interactions. Give your health care provider a list of all the medicines, herbs, non-prescription drugs, or dietary supplements you use. Also tell them if you smoke, drink alcohol, or use illegal drugs. Some items may interact with your medicine. What should I watch for while using this medication? Visit your healthcare professional for regular checks on your progress. You may need blood work done while you are taking this medication. Your condition will be monitored carefully while you are receiving this medication. It is important not to miss any appointments. Avoid sports and activities that might cause injury while you are using this medication. Severe falls or injuries can cause unseen bleeding. Be careful when using  sharp tools or knives. Consider using an Copy. Take special care brushing or flossing your teeth. Report any injuries, bruising, or red spots on the skin to your healthcare professional. If you are going to need surgery or other procedure, tell your healthcare professional that you are taking this medication. Wear a medical ID bracelet or chain. Carry a card that describes your disease and details of your medication and dosage times. What side effects may I notice from receiving this medication? Side effects that you should report to your care team as soon as possible: Allergic reactions--skin rash, itching, hives, swelling of the face, lips, tongue, or throat Bleeding--bloody or black, tar-like stools, vomiting blood or brown material that looks like coffee grounds, red or dark brown urine, small red or purple spots on the skin, unusual bruising or bleeding Bleeding in the brain--severe headache, stiff neck, confusion, dizziness, change in vision, numbness or weakness of the face, arm, or leg, trouble speaking, trouble walking, vomiting Heavy periods This list may not describe all possible side effects. Call your doctor for medical advice about side effects. You may report side effects to FDA at 1-800-FDA-1088. Where should I keep my medication? Keep out of the reach of children and pets. Store at room temperature between 20 and 25 degrees C (68 and 77 degrees  F). Get rid of any unused medication after the expiration date. To get rid of medications that are no longer needed or expired: Take the medication to a medication take-back program. Check with your pharmacy or law enforcement to find a location. If you cannot return the medication, check the label or package insert to see if the medication should be thrown out in the garbage or flushed down the toilet. If you are not sure, ask your care team. If it is safe to put in the trash, empty the medication out of the container. Mix the  medication with cat litter, dirt, coffee grounds, or other unwanted substance. Seal the mixture in a bag or container. Put it in the trash. NOTE: This sheet is a summary. It may not cover all possible information. If you have questions about this medicine, talk to your doctor, pharmacist, or health care provider.  2023 Elsevier/Gold Standard (2020-01-17 00:00:00)    Atrial Fibrillation  Atrial fibrillation is a type of heartbeat that is irregular or fast. If you have this condition, your heart beats without any order. This makes it hard for your heart to pump blood in a normal way. Atrial fibrillation may come and go, or it may become a long-lasting problem. If this condition is not treated, it can put you at higher risk for stroke, heart failure, and other heart problems. What are the causes? This condition may be caused by diseases that damage the heart. They include: High blood pressure. Heart failure. Heart valve disease. Heart surgery. Other causes include: Diabetes. Thyroid disease. Being overweight. Kidney disease. Sometimes the cause is not known. What increases the risk? You are more likely to develop this condition if: You are older. You smoke. You exercise often and very hard. You have a family history of this condition. You are a man. You use drugs. You drink a lot of alcohol. You have lung conditions, such as emphysema, pneumonia, or COPD. You have sleep apnea. What are the signs or symptoms? Common symptoms of this condition include: A feeling that your heart is beating very fast. Chest pain or discomfort. Feeling short of breath. Suddenly feeling light-headed or weak. Getting tired easily during activity. Fainting. Sweating. In some cases, there are no symptoms. How is this treated? Treatment for this condition depends on underlying conditions and how you feel when you have atrial fibrillation. They include: Medicines to: Prevent blood clots. Treat heart  rate or heart rhythm problems. Using devices, such as a pacemaker, to correct heart rhythm problems. Doing surgery to remove the part of the heart that sends bad signals. Closing an area where clots can form in the heart (left atrial appendage). In some cases, your doctor will treat other underlying conditions. Follow these instructions at home: Medicines Take over-the-counter and prescription medicines only as told by your doctor. Do not take any new medicines without first talking to your doctor. If you are taking blood thinners: Talk with your doctor before you take any medicines that have aspirin or NSAIDs, such as ibuprofen, in them. Take your medicine exactly as told by your doctor. Take it at the same time each day. Avoid activities that could hurt or bruise you. Follow instructions about how to prevent falls. Wear a bracelet that says you are taking blood thinners. Or, carry a card that lists what medicines you take. Lifestyle     Do not use any products that have nicotine or tobacco in them. These include cigarettes, e-cigarettes, and chewing tobacco. If you  need help quitting, ask your doctor. Eat heart-healthy foods. Talk with your doctor about the right eating plan for you. Exercise regularly as told by your doctor. Do not drink alcohol. Lose weight if you are overweight. Do not use drugs, including cannabis. General instructions If you have a condition that causes breathing to stop for a short period of time (apnea), treat it as told by your doctor. Keep a healthy weight. Do not use diet pills unless your doctor says they are safe for you. Diet pills may make heart problems worse. Keep all follow-up visits as told by your doctor. This is important. Contact a doctor if: You notice a change in the speed, rhythm, or strength of your heartbeat. You are taking a blood-thinning medicine and you get more bruising. You get tired more easily when you move or exercise. You have a  sudden change in weight. Get help right away if:  You have pain in your chest or your belly (abdomen). You have trouble breathing. You have side effects of blood thinners, such as blood in your vomit, poop (stool), or pee (urine), or bleeding that cannot stop. You have any signs of a stroke. "BE FAST" is an easy way to remember the main warning signs: B - Balance. Signs are dizziness, sudden trouble walking, or loss of balance. E - Eyes. Signs are trouble seeing or a change in how you see. F - Face. Signs are sudden weakness or loss of feeling in the face, or the face or eyelid drooping on one side. A - Arms. Signs are weakness or loss of feeling in an arm. This happens suddenly and usually on one side of the body. S - Speech. Signs are sudden trouble speaking, slurred speech, or trouble understanding what people say. T - Time. Time to call emergency services. Write down what time symptoms started. You have other signs of a stroke, such as: A sudden, very bad headache with no known cause. Feeling like you may vomit (nausea). Vomiting. A seizure. These symptoms may be an emergency. Do not wait to see if the symptoms will go away. Get medical help right away. Call your local emergency services (911 in the [place] Do not drive yourself to the hospital. Summary Atrial fibrillation is a type of heartbeat that is irregular or fast. You are at higher risk of this condition if you smoke, are older, have diabetes, or are overweight. Follow your doctor's instructions about medicines, diet, exercise, and follow-up visits. Get help right away if you have signs or symptoms of a stroke. Get help right away if you cannot catch your breath, or you have chest pain or discomfort. This information is not intended to replace advice given to you by your health care provider. Make sure you discuss any questions you have with your health care provider. Document Revised: 06/13/2018 Document Reviewed:  06/13/2018 Elsevier Patient Education  [place].

## 2021-09-22 NOTE — Progress Notes (Signed)
Kent Moment,  Do I reach out and schedule for a telephone office visit?  [name], [place], [place] [address] Direct Dial: [phone] [email or web address]

## 2021-09-23 ENCOUNTER — Telehealth: Payer: Self-pay

## 2021-09-23 NOTE — Chronic Care Management (AMB) (Signed)
  Care Coordination   Note   09/23/2021 Name: Kent James MRN: [id number] DOB: 05-02-1945  Kent James is a 76 y.o. year old male who sees [name], [name], NP for primary care. I reached out to [name] by phone today to offer care coordination services.  Mr. Glaspy was given information about Care Coordination services today including:   The Care Coordination services include support from the care team which includes your Nurse Coordinator, Clinical Social Worker, or Pharmacist.  The Care Coordination team is here to help remove barriers to the health concerns and goals most important to you. Care Coordination services are voluntary, and the patient may decline or stop services at any time by request to their care team member.   Care Coordination Consent Status: Patient agreed to services and verbal consent obtained.   Follow up plan:  Telephone appointment with care coordination team member scheduled for:  10/04/2021  Encounter Outcome:  Pt. Scheduled  [name], [place], [place] [address] Direct Dial: [phone] [email or web address]

## 2021-10-04 ENCOUNTER — Telehealth: Payer: Medicare HMO

## 2021-10-29 ENCOUNTER — Other Ambulatory Visit: Payer: Self-pay | Admitting: Internal Medicine

## 2021-10-29 DIAGNOSIS — I480 Paroxysmal atrial fibrillation: Secondary | ICD-10-CM

## 2021-10-29 NOTE — Telephone Encounter (Signed)
Eliquis '5mg'$  refill request received. Patient is 76 years old, weight-115.5kg, Crea-0.94 on 11/20/2020, Diagnosis-Afib, and last seen by [name] on 02/19/2021. Dose is appropriate based on dosing criteria. Will send in refill to requested pharmacy.

## 2021-11-19 ENCOUNTER — Encounter: Payer: Self-pay | Admitting: Internal Medicine

## 2021-11-22 ENCOUNTER — Encounter (HOSPITAL_COMMUNITY): Payer: Self-pay | Admitting: Emergency Medicine

## 2021-11-22 ENCOUNTER — Ambulatory Visit (HOSPITAL_COMMUNITY)
Admission: EM | Admit: 2021-11-22 | Discharge: 2021-11-22 | Disposition: A | Discharge status: Home / Self Care | Payer: Medicare HMO | Attending: Internal Medicine | Admitting: Internal Medicine

## 2021-11-22 DIAGNOSIS — R21 Rash and other nonspecific skin eruption: Secondary | ICD-10-CM | POA: Diagnosis not present

## 2021-11-22 DIAGNOSIS — L089 Local infection of the skin and subcutaneous tissue, unspecified: Secondary | ICD-10-CM

## 2021-11-22 MED ORDER — CEPHALEXIN 500 MG PO CAPS: 500.0000 mg | ORAL_CAPSULE | Freq: Two times a day (BID) | ORAL | 0 refills | Status: AC

## 2021-11-22 MED ORDER — MUPIROCIN 2 % EX OINT: 1.0000 | TOPICAL_OINTMENT | Freq: Two times a day (BID) | CUTANEOUS | 0 refills | Status: DC

## 2021-11-22 NOTE — ED Triage Notes (Signed)
Pt c/o rash on right side of neck and right side of face for a week. Reports had some drainage from some areas.  Reports did have pressure pain and some in posterior head that are sensitive with touch.

## 2021-11-22 NOTE — Discharge Instructions (Signed)
Take Keflex twice daily for the next 7 days. Place mupirocin ointment to the area twice daily for the next 7 days as well.  Clean the rash with gentle soap and water.  If you develop any new or worsening symptoms or do not improve in the next 2 to 3 days, please return.  If your symptoms are severe, please go to the emergency room.  Follow-up with your primary care provider for further evaluation and management of your symptoms as well as ongoing wellness visits.  I hope you feel better!

## 2021-11-22 NOTE — ED Provider Notes (Addendum)
[facility]    CSN: [id number] Arrival date & time: 11/22/21  1425      History   Chief Complaint Chief Complaint  Patient presents with   Rash    HPI Kent James is a 76 y.o. male.   Patient presents urgent care for evaluation of rash to the right side of the neck that started approximately 1 week ago.  Upon onset, rash consisted of pimples that were slightly painful but more so uncomfortable.  Patient popped the pimples to the rash a few days ago and states that they have become more healed but are beginning to have some crusty drainage that is white/yellow.  He states that some of the lesions had an area of swelling underneath them that was approximately the size of a nickel.  The swelling reduced after he popped the lesions.  He has not been placing any ointments or creams to the area to help with the rash.  Denies history of shingles.  No recent exposure to new laundry detergents, personal hygiene products, or bedding/poisonous plants.  The rash is mildly itchy, but patient states the rash is mostly uncomfortable and has a "pressure" sensation.  No recent antibiotics, steroids, or other changes to medications. Patient's blood pressure is noticeably elevated in the clinic.  Upon recheck, blood pressure is 150/99.  He states that second blood pressure is more consistent with his baseline.  He is not having any headache, dizziness, blurry vision, or fever/chills.     Past Medical History:  Diagnosis Date   Childhood asthma    Essential hypertension    GERD (gastroesophageal reflux disease)    History of chicken pox    History of colon polyps    Hyperlipidemia    Persistent atrial fibrillation ([place])    Snoring     Patient Active Problem List   Diagnosis Date Noted   Class 2 obesity due to excess calories with body mass index (BMI) of 36.0 to 36.9 in adult 01/05/2021   Essential hypertension 12/04/2017   Paroxysmal atrial fibrillation ([place]) 03/14/2017    Gastroesophageal reflux disease without esophagitis 03/14/2017   Pure hypercholesterolemia 03/14/2017    Past Surgical History:  Procedure Laterality Date   ATRIAL FIBRILLATION ABLATION N/A 12/11/2018   Procedure: ATRIAL FIBRILLATION ABLATION;  Surgeon: [place] Grayer, MD;  Location: [facility];  Service: Cardiovascular;  Laterality: N/A;   CARDIOVERSION     IMAGE GUIDED SINUS SURGERY N/A 10/29/2019   Procedure: IMAGE GUIDED SINUS SURGERY;  Surgeon: Carloyn Manner, MD;  Location: [facility];  Service: ENT;  Laterality: N/A;   MAXILLARY ANTROSTOMY Right 10/29/2019   Procedure: MAXILLARY ANTROSTOMY;  Surgeon: Carloyn Manner, MD;  Location: [facility];  Service: ENT;  Laterality: Right;   TEE WITHOUT CARDIOVERSION N/A 12/11/2018   Procedure: TRANSESOPHAGEAL ECHOCARDIOGRAM (TEE);  Surgeon: Elouise Munroe, MD;  Location: [facility];  Service: Cardiology;  Laterality: N/A;       Home Medications    Prior to Admission medications   Medication Sig Start Date End Date Taking? Authorizing Provider  cephALEXin (KEFLEX) 500 MG capsule Take 1 capsule (500 mg total) by mouth 2 (two) times daily for 7 days. 11/22/21 11/29/21 Yes Celsa Nordahl, Stasia Cavalier, FNP  mupirocin ointment (BACTROBAN) 2 % Apply 1 Application topically 2 (two) times daily. 11/22/21  Yes Zyaire Dumas, Stasia Cavalier, FNP  apixaban (ELIQUIS) 5 MG TABS tablet TAKE 1 TABLET BY MOUTH TWICE A DAY 10/29/21   Allred, Jeneen Rinks, MD  lisinopril (ZESTRIL) 20  MG tablet TAKE 1 TABLET BY MOUTH EVERY DAY 01/01/21   Allred, Jeneen Rinks, MD  omeprazole (PRILOSEC) 20 MG capsule Take 20 mg by mouth daily.    [provider]    Family History Family History  Problem Relation Age of Onset   Heart attack Father    Breast cancer Sister    Arthritis Maternal Grandmother     Social History Social History   Tobacco Use   Smoking status: Former    Types: Cigarettes    Start date: 03/14/2017    Passive exposure: Never   Smokeless tobacco:  Never  Vaping Use   Vaping Use: Never used  Substance Use Topics   Alcohol use: Yes    Alcohol/week: 2.0 standard drinks of alcohol    Types: 2 Standard drinks or equivalent per week    Comment: couple daily   Drug use: No     Allergies   Patient has no known allergies.   Review of Systems Review of Systems Per HPI  Physical Exam Triage Vital Signs ED Triage Vitals  Enc Vitals Group     BP 11/22/21 1610 (!) 182/102     Pulse Rate 11/22/21 1610 79     Resp 11/22/21 1610 18     Temp 11/22/21 1610 97.8 F (36.6 C)     Temp Source 11/22/21 1610 Oral     SpO2 11/22/21 1610 97 %     Weight --      Height --      Head Circumference --      Peak Flow --      Pain Score 11/22/21 1609 0     Pain Loc --      Pain Edu? --      Excl. in [place]? --    No data found.  Updated Vital Signs BP (!) 182/102 (BP Location: Left Arm)   Pulse 79   Temp 97.8 F (36.6 C) (Oral)   Resp 18   SpO2 97%   Visual Acuity Right Eye Distance:   Left Eye Distance:   Bilateral Distance:    Right Eye Near:   Left Eye Near:    Bilateral Near:     Physical Exam Vitals and nursing note reviewed.  Constitutional:      Appearance: He is not ill-appearing or toxic-appearing.  HENT:     Head: Normocephalic and atraumatic.     Right Ear: Hearing and external ear normal.     Left Ear: Hearing and external ear normal.     Nose: Nose normal.     Mouth/Throat:     Lips: Pink.  Eyes:     General: Lids are normal. Vision grossly intact. Gaze aligned appropriately.     Extraocular Movements: Extraocular movements intact.     Conjunctiva/sclera: Conjunctivae normal.  Pulmonary:     Effort: Pulmonary effort is normal.  Musculoskeletal:     Cervical back: Neck supple.  Skin:    General: Skin is warm and dry.     Capillary Refill: Capillary refill takes less than 2 seconds.     Findings: Rash present.     Comments: See images of rash below for further detail.  There is no warmth, underlying soft  tissue swelling, or tenderness to the rash.  No cervical lymph node swelling.  Neurological:     General: No focal deficit present.     Mental Status: He is alert and oriented to person, place, and time. Mental status is at baseline.  Cranial Nerves: No dysarthria or facial asymmetry.  Psychiatric:        Mood and Affect: Mood normal.        Speech: Speech normal.        Behavior: Behavior normal.        Thought Content: Thought content normal.        Judgment: Judgment normal.      UC Treatments / Results  Labs (all labs ordered are listed, but only abnormal results are displayed) Labs Reviewed - No data to display  EKG   Radiology No results found.  Procedures Procedures (including critical care time)  Medications Ordered in UC Medications - No data to display  Initial Impression / Assessment and Plan / UC Course  I have reviewed the triage vital signs and the nursing notes.  Pertinent labs & imaging results that were available during my care of the patient were reviewed by me and considered in my medical decision making (see chart for details).   1.  Rash and nonspecific skin eruption, skin infection Rash likely began as an acute shingles infection but has now turned into a secondary bacterial infection due to patient's manipulation of the lesions.  He may take Keflex twice daily for the next 7 days.  He may place mupirocin ointment to the area twice daily for the next 7 days as well.  He is to clean the rash with gentle soap and warm water.  Advised to avoid scrubbing the rash as this could cause bleeding and worsening symptoms.  Follow-up with PCP in the next few days if symptoms fail to improve with antibiotic medications.   Discussed physical exam and available lab work findings in clinic with patient.  Counseled patient regarding appropriate use of medications and potential side effects for all medications recommended or prescribed today. Discussed red flag signs  and symptoms of worsening condition,when to call the PCP office, return to urgent care, and when to seek higher level of care in the emergency department. Patient verbalizes understanding and agreement with plan. All questions answered. Patient discharged in stable condition.    Final Clinical Impressions(s) / UC Diagnoses   Final diagnoses:  Rash and nonspecific skin eruption  Skin infection     Discharge Instructions      Take Keflex twice daily for the next 7 days. Place mupirocin ointment to the area twice daily for the next 7 days as well.  Clean the rash with gentle soap and water.  If you develop any new or worsening symptoms or do not improve in the next 2 to 3 days, please return.  If your symptoms are severe, please go to the emergency room.  Follow-up with your primary care provider for further evaluation and management of your symptoms as well as ongoing wellness visits.  I hope you feel better!     ED Prescriptions     Medication Sig Dispense Auth. Provider   cephALEXin (KEFLEX) 500 MG capsule Take 1 capsule (500 mg total) by mouth 2 (two) times daily for 7 days. 14 capsule Talbot Grumbling, FNP   mupirocin ointment (BACTROBAN) 2 % Apply 1 Application topically 2 (two) times daily. 22 g Talbot Grumbling, FNP      PDMP not reviewed this encounter.   Talbot Grumbling, [place] 11/22/21 [place], [place], FNP 11/22/21 1729

## 2021-11-30 ENCOUNTER — Encounter: Payer: Self-pay | Admitting: Internal Medicine

## 2021-11-30 NOTE — Telephone Encounter (Signed)
Yes, I would like to see him in person for follow up

## 2021-12-21 ENCOUNTER — Other Ambulatory Visit: Payer: Self-pay

## 2021-12-21 MED ORDER — LISINOPRIL 20 MG PO TABS: 20.0000 mg | ORAL_TABLET | Freq: Every day | ORAL | 0 refills | Status: DC

## 2021-12-21 NOTE — Telephone Encounter (Signed)
Pt's medication was sent to pt's pharmacy as requested. Confirmation received.  °

## 2022-01-10 ENCOUNTER — Ambulatory Visit (INDEPENDENT_AMBULATORY_CARE_PROVIDER_SITE_OTHER): Payer: Medicare HMO | Admitting: Internal Medicine

## 2022-01-10 ENCOUNTER — Encounter: Payer: Self-pay | Admitting: Internal Medicine

## 2022-01-10 VITALS — BP 138/86 | HR 69 | Temp 96.8°F | Ht 71.0 in | Wt 254.0 lb

## 2022-01-10 DIAGNOSIS — E7800 Pure hypercholesterolemia, unspecified: Secondary | ICD-10-CM | POA: Diagnosis not present

## 2022-01-10 DIAGNOSIS — I10 Essential (primary) hypertension: Secondary | ICD-10-CM

## 2022-01-10 DIAGNOSIS — Z6835 Body mass index (BMI) 35.0-35.9, adult: Secondary | ICD-10-CM | POA: Diagnosis not present

## 2022-01-10 DIAGNOSIS — Z0001 Encounter for general adult medical examination with abnormal findings: Secondary | ICD-10-CM | POA: Diagnosis not present

## 2022-01-10 DIAGNOSIS — R7309 Other abnormal glucose: Secondary | ICD-10-CM

## 2022-01-10 DIAGNOSIS — Z23 Encounter for immunization: Secondary | ICD-10-CM

## 2022-01-10 DIAGNOSIS — Z125 Encounter for screening for malignant neoplasm of prostate: Secondary | ICD-10-CM

## 2022-01-10 NOTE — Assessment & Plan Note (Signed)
Discussed BP goal of 130/80 He declines increasing lisinopril to 40 mg at this time Reinforced DASH diet and exercise for weight loss

## 2022-01-10 NOTE — Addendum Note (Signed)
Addended by: Ashley Royalty E on: 01/10/2022 04:19 PM   Modules accepted: Orders

## 2022-01-10 NOTE — Progress Notes (Signed)
Subjective:    Patient ID: Kent James, male    DOB: [birth year], 77 y.o.   MRN: [id number]  HPI  Patient presents to clinic today for his annual exam.  Flu: 10/2021 Tetanus: > 10 years ago COVID: Pfizer x 2 Pneumovax: 03/2015 Prevnar: 02/2014 Shingrix: Never PSA screening: 03/2019  Colon screening: > 10 years ago Vision screening: as needed Dentist: as needed  Diet: He does eat meat. He consumes fruits and veggies. He does eat some fried foods. He drinks mostly water, milk, juice. Exercise: None  Review of Systems     Past Medical History:  Diagnosis Date   Childhood asthma    Essential hypertension    GERD (gastroesophageal reflux disease)    History of chicken pox    History of colon polyps    Hyperlipidemia    Persistent atrial fibrillation ([facility])    Snoring     Current Outpatient Medications  Medication Sig Dispense Refill   apixaban (ELIQUIS) 5 MG TABS tablet TAKE 1 TABLET BY MOUTH TWICE A DAY 180 tablet 0   lisinopril (ZESTRIL) 20 MG tablet Take 1 tablet (20 mg total) by mouth daily. 90 tablet 0   mupirocin ointment (BACTROBAN) 2 % Apply 1 Application topically 2 (two) times daily. 22 g 0   omeprazole (PRILOSEC) 20 MG capsule Take 20 mg by mouth daily.     No current facility-administered medications for this visit.    No Known Allergies  Family History  Problem Relation Age of Onset   Heart attack Father    Breast cancer Sister    Arthritis Maternal Grandmother     Social History   Socioeconomic History   Marital status: Married    Spouse name: Not on file   Number of children: Not on file   Years of education: Not on file   Highest education level: Not on file  Occupational History   Not on file  Tobacco Use   Smoking status: Former    Types: Cigarettes    Start date: 03/14/2017    Passive exposure: Never   Smokeless tobacco: Never  Vaping Use   Vaping Use: Never used  Substance and Sexual Activity   Alcohol use: Yes    Alcohol/week:  2.0 standard drinks of alcohol    Types: 2 Standard drinks or equivalent per week    Comment: couple daily   Drug use: No   Sexual activity: Not on file  Other Topics Concern   Not on file  Social History Narrative   Lives in [place] with spouse.   Retired Hotel manager   Social Determinants of Radio broadcast assistant Strain: [place]  (09/21/2021)   Overall Financial Resource Strain (CARDIA)    Difficulty of Paying Living Expenses: Not very hard  Food Insecurity: No Food Insecurity (09/21/2021)   Hunger Vital Sign    Worried About Running Out of Food in the Last Year: Never true    Ran Out of Food in the Last Year: Never true  Transportation Needs: No Transportation Needs (09/21/2021)   PRAPARE - Hydrologist (Medical): No    Lack of Transportation (Non-Medical): No  Physical Activity: Not on file  Stress: No Stress Concern Present (09/21/2021)   [place]    Feeling of Stress : Not at all  Social Connections: Not on file  Intimate Partner Violence: Not on file     Constitutional: Denies fever, malaise, fatigue,  headache or abrupt weight changes.  HEENT: Denies eye pain, eye redness, ear pain, ringing in the ears, wax buildup, runny nose, nasal congestion, bloody nose, or sore throat. Respiratory: Denies difficulty breathing, shortness of breath, cough or sputum production.   Cardiovascular: Denies chest pain, chest tightness, palpitations or swelling in the hands or feet.  Gastrointestinal: Denies abdominal pain, bloating, constipation, diarrhea or blood in the stool.  GU: Denies urgency, frequency, pain with urination, burning sensation, blood in urine, odor or discharge. Musculoskeletal: Denies decrease in range of motion, difficulty with gait, muscle pain or joint pain and swelling.  Skin: Denies redness, rashes, lesions or ulcercations.  Neurological: Denies dizziness,  difficulty with memory, difficulty with speech or problems with balance and coordination.  Psych: Denies anxiety, depression, SI/HI.  No other specific complaints in a complete review of systems (except as listed in HPI above).  Objective:   Physical Exam   BP 138/86 (BP Location: Left Arm, Patient Position: Sitting, Cuff Size: Large)   Pulse 69   Temp (!) 96.8 F (36 C) (Temporal)   Ht '5\' 11"'$  (1.803 m)   Wt 254 lb (115.2 kg)   SpO2 98%   BMI 35.43 kg/m   Wt Readings from Last 3 Encounters:  02/19/21 254 lb 9.6 oz (115.5 kg)  01/05/21 254 lb 9.6 oz (115.5 kg)  04/29/20 249 lb 3.2 oz (113 kg)    General: Appears his stated age, obese, in NAD. Skin: Warm, dry and intact. HEENT: Head: normal shape and size; Eyes: sclera white, no icterus, conjunctiva pink, PERRLA and EOMs intact;  Neck:  Neck supple, trachea midline. No masses, lumps or thyromegaly present.  Cardiovascular: Normal rate and rhythm. S1,S2 noted.  No murmur, rubs or gallops noted. No JVD or BLE edema. No carotid bruits noted. Pulmonary/Chest: Normal effort and positive vesicular breath sounds. No respiratory distress. No wheezes, rales or ronchi noted.  Abdomen: Normal bowel sounds.  Musculoskeletal: 5/5 BUE/BLE.  No difficulty with gait.  Neurological: Alert and oriented. Cranial nerves II-XII grossly intact. Coordination normal.  Psychiatric: Mood and affect normal. Behavior is normal. Judgment and thought content normal.     BMET    Component Value Date/Time   NA 142 11/20/2020 1002   K 4.5 11/20/2020 1002   CL 103 11/20/2020 1002   CO2 26 11/20/2020 1002   GLUCOSE 106 (H) 11/20/2020 1002   GLUCOSE 121 (H) 10/25/2019 1056   BUN 10 11/20/2020 1002   CREATININE 0.94 11/20/2020 1002   CALCIUM 8.7 11/20/2020 1002   GFRNONAA >60 10/25/2019 1056   GFRAA >60 01/08/2019 1200    Lipid Panel     Component Value Date/Time   CHOL 177 03/19/2019 1116   TRIG 72.0 03/19/2019 1116   HDL 35.30 (L) 03/19/2019  1116   CHOLHDL 5 03/19/2019 1116   VLDL 14.4 03/19/2019 1116   LDLCALC 127 (H) 03/19/2019 1116    CBC    Component Value Date/Time   WBC 5.9 11/20/2020 1002   WBC 8.9 10/25/2019 1056   RBC 5.42 11/20/2020 1002   RBC 5.40 10/25/2019 1056   HGB 16.9 11/20/2020 1002   HCT 49.0 11/20/2020 1002   PLT 177 11/20/2020 1002   MCV 90 11/20/2020 1002   MCH 31.2 11/20/2020 1002   MCH 31.7 10/25/2019 1056   MCHC 34.5 11/20/2020 1002   MCHC 35.6 10/25/2019 1056   RDW 13.4 11/20/2020 1002   LYMPHSABS 1.2 11/23/2018 1050   EOSABS 0.3 11/23/2018 1050   BASOSABS 0.1 11/23/2018  1050    Hgb A1C No results found for: "HGBA1C"         Assessment & Plan:   Preventative health maintenance:  Flu shot UTD He declines tetanus for financial reasons, advised him if he gets bit or cut to go get this done at the pharmacy Encouraged him to get his COVID booster Prevnar 20 today Discussed Shingrix vaccine, he will check coverage with his insurance company and schedule a visit at the pharmacy if he would like to have this done He no longer needs to screen for colon cancer Encouraged him to consume a balanced diet and exercise regimen Advised him to see an eye doctor and dentist annually We will check CBC, c-Met, lipid, PSA and A1c today  RTC in 6 months, follow-up chronic conditions Webb Silversmith, NP

## 2022-01-10 NOTE — Assessment & Plan Note (Signed)
Encourage diet and exercise for weight loss 

## 2022-01-10 NOTE — Patient Instructions (Signed)
Health Maintenance After Age 77 After age 77, you are at a higher risk for certain long-term diseases and infections as well as injuries from falls. Falls are a major cause of broken bones and head injuries in people who are older than age 77. Getting regular preventive care can help to keep you healthy and well. Preventive care includes getting regular testing and making lifestyle changes as recommended by your health care provider. Talk with your health care provider about: Which screenings and tests you should have. A screening is a test that checks for a disease when you have no symptoms. A diet and exercise plan that is right for you. What should I know about screenings and tests to prevent falls? Screening and testing are the best ways to find a health problem early. Early diagnosis and treatment give you the best chance of managing medical conditions that are common after age 77. Certain conditions and lifestyle choices may make you more likely to have a fall. Your health care provider may recommend: Regular vision checks. Poor vision and conditions such as cataracts can make you more likely to have a fall. If you wear glasses, make sure to get your prescription updated if your vision changes. Medicine review. Work with your health care provider to regularly review all of the medicines you are taking, including over-the-counter medicines. Ask your health care provider about any side effects that may make you more likely to have a fall. Tell your health care provider if any medicines that you take make you feel dizzy or sleepy. Strength and balance checks. Your health care provider may recommend certain tests to check your strength and balance while standing, walking, or changing positions. Foot health exam. Foot pain and numbness, as well as not wearing proper footwear, can make you more likely to have a fall. Screenings, including: Osteoporosis screening. Osteoporosis is a condition that causes  the bones to get weaker and break more easily. Blood pressure screening. Blood pressure changes and medicines to control blood pressure can make you feel dizzy. Depression screening. You may be more likely to have a fall if you have a fear of falling, feel depressed, or feel unable to do activities that you used to do. Alcohol use screening. Using too much alcohol can affect your balance and may make you more likely to have a fall. Follow these instructions at home: Lifestyle Do not drink alcohol if: Your health care provider tells you not to drink. If you drink alcohol: Limit how much you have to: 0-1 drink a day for women. 0-2 drinks a day for men. Know how much alcohol is in your drink. In the [place] one drink equals one 12 oz bottle of beer (355 mL), one 5 oz glass of wine (148 mL), or one 1 oz glass of hard liquor (44 mL). Do not use any products that contain nicotine or tobacco. These products include cigarettes, chewing tobacco, and vaping devices, such as e-cigarettes. If you need help quitting, ask your health care provider. Activity  Follow a regular exercise program to stay fit. This will help you maintain your balance. Ask your health care provider what types of exercise are appropriate for you. If you need a cane or walker, use it as recommended by your health care provider. Wear supportive shoes that have nonskid soles. Safety  Remove any tripping hazards, such as rugs, cords, and clutter. Install safety equipment such as grab bars in bathrooms and safety rails on stairs. Keep rooms and walkways   well-lit. General instructions Talk with your health care provider about your risks for falling. Tell your health care provider if: You fall. Be sure to tell your health care provider about all falls, even ones that seem minor. You feel dizzy, tiredness (fatigue), or off-balance. Take over-the-counter and prescription medicines only as told by your health care provider. These include  supplements. Eat a healthy diet and maintain a healthy weight. A healthy diet includes low-fat dairy products, low-fat (lean) meats, and fiber from whole grains, beans, and lots of fruits and vegetables. Stay current with your vaccines. Schedule regular health, dental, and eye exams. Summary Having a healthy lifestyle and getting preventive care can help to protect your health and wellness after age 77. Screening and testing are the best way to find a health problem early and help you avoid having a fall. Early diagnosis and treatment give you the best chance for managing medical conditions that are more common for people who are older than age 77. Falls are a major cause of broken bones and head injuries in people who are older than age 77. Take precautions to prevent a fall at home. Work with your health care provider to learn what changes you can make to improve your health and wellness and to prevent falls. This information is not intended to replace advice given to you by your health care provider. Make sure you discuss any questions you have with your health care provider. Document Revised: 05/11/2020 Document Reviewed: 05/11/2020 Elsevier Patient Education  2023 Elsevier Inc.  

## 2022-01-11 ENCOUNTER — Encounter: Payer: Self-pay | Admitting: Internal Medicine

## 2022-01-11 LAB — CBC
HCT: 48.7 % (ref 38.5–50.0)
Hemoglobin: 17.1 g/dL (ref 13.2–17.1)
MCH: 31.6 pg (ref 27.0–33.0)
MCHC: 35.1 g/dL (ref 32.0–36.0)
MCV: 90 fL (ref 80.0–100.0)
MPV: 10.5 fL (ref 7.5–12.5)
Platelets: 173 10*3/uL (ref 140–400)
RBC: 5.41 10*6/uL (ref 4.20–5.80)
RDW: 13.5 % (ref 11.0–15.0)
WBC: 7.3 10*3/uL (ref 3.8–10.8)

## 2022-01-11 LAB — COMPLETE METABOLIC PANEL WITH GFR
AG Ratio: 1.6 (calc) (ref 1.0–2.5)
ALT: 14 U/L (ref 9–46)
AST: 18 U/L (ref 10–35)
Albumin: 4.4 g/dL (ref 3.6–5.1)
Alkaline phosphatase (APISO): 74 U/L (ref 35–144)
BUN: 12 mg/dL (ref 7–25)
CO2: 27 mmol/L (ref 20–32)
Calcium: 9.3 mg/dL (ref 8.6–10.3)
Chloride: 104 mmol/L (ref 98–110)
Creat: 0.91 mg/dL (ref 0.70–1.28)
Globulin: 2.7 g/dL (calc) (ref 1.9–3.7)
Glucose, Bld: 112 mg/dL — ABNORMAL HIGH (ref 65–99)
Potassium: 4.1 mmol/L (ref 3.5–5.3)
Sodium: 141 mmol/L (ref 135–146)
Total Bilirubin: 0.8 mg/dL (ref 0.2–1.2)
Total Protein: 7.1 g/dL (ref 6.1–8.1)
eGFR: 87 mL/min/{1.73_m2} (ref >=60)

## 2022-01-11 LAB — PSA: PSA: 6.3 ng/mL — ABNORMAL HIGH (ref <=4.00)

## 2022-01-11 LAB — LIPID PANEL
Cholesterol: 188 mg/dL (ref <200)
HDL: 44 mg/dL (ref >=40)
LDL Cholesterol (Calc): 122 mg/dL (calc) — ABNORMAL HIGH
Non-HDL Cholesterol (Calc): 144 mg/dL (calc) — ABNORMAL HIGH (ref <130)
Total CHOL/HDL Ratio: 4.3 (calc) (ref <5.0)
Triglycerides: 117 mg/dL (ref <150)

## 2022-01-11 LAB — HEMOGLOBIN A1C
Hgb A1c MFr Bld: 5.6 % of total Hgb (ref <5.7)
Mean Plasma Glucose: 114 mg/dL
eAG (mmol/L): 6.3 mmol/L

## 2022-01-12 DIAGNOSIS — H52223 Regular astigmatism, bilateral: Secondary | ICD-10-CM | POA: Diagnosis not present

## 2022-01-17 ENCOUNTER — Ambulatory Visit: Payer: Medicare HMO | Admitting: Physician Assistant

## 2022-02-08 ENCOUNTER — Telehealth: Payer: Self-pay | Admitting: Cardiology

## 2022-02-08 NOTE — Telephone Encounter (Signed)
Patient was wanting to get a refill on his eliquis medication and only had about 4 days left. Best way to contact patient is this number [phone]

## 2022-02-10 ENCOUNTER — Telehealth: Payer: Self-pay | Admitting: Cardiology

## 2022-02-10 DIAGNOSIS — I480 Paroxysmal atrial fibrillation: Secondary | ICD-10-CM

## 2022-02-10 MED ORDER — APIXABAN 5 MG PO TABS: 5.0000 mg | ORAL_TABLET | Freq: Two times a day (BID) | ORAL | 1 refills | Status: DC

## 2022-02-10 NOTE — Telephone Encounter (Signed)
Eliquis '5mg'$  refill request received. Patient is 77 years old, weight-115.2kg, Crea-0.91 on 01/10/22, Diagnosis-Afib, and last seen by [name] on 02/19/21 & has an appt with Dr. Quentin Ore on 02/15/22. Dose is appropriate based on dosing criteria. Will send in refill to requested pharmacy.

## 2022-02-10 NOTE — Telephone Encounter (Signed)
*  STAT* If patient is at the pharmacy, call can be transferred to refill team.   1. Which medications need to be refilled? (please list name of each medication and dose if known)  apixaban (ELIQUIS) 5 MG TABS tablet   2. Which pharmacy/location (including street and city if local pharmacy) is medication to be sent to? [id number] [place],  - [address]   3. Do they need a 30 day or 90 day supply? 90 day  Patient will be out of medication by Saturday.

## 2022-02-14 NOTE — Progress Notes (Unsigned)
Electrophysiology Office Follow up Visit Note:    Date:  02/15/2022   ID:  Kent James, DOB 18-Nov-1945, MRN [id number]  PCP:  Jearld Fenton, NP  [facility] Cardiologist:  None  [facility] Electrophysiologist:  Vickie Epley, MD    Interval History:    Kent James is a 77 y.o. male who presents for a follow up visit. They were last seen in clinic by [name] February 19, 2021.  The patient has a history of atrial fibrillation post ablation in December 2020.  He has been on Eliquis for stroke prophylaxis.  He presents today to establish EP care and to discuss left atrial appendage occlusion as a stroke risk mitigation strategy.       Past Medical History:  Diagnosis Date   Childhood asthma    Essential hypertension    GERD (gastroesophageal reflux disease)    History of chicken pox    History of colon polyps    Hyperlipidemia    Persistent atrial fibrillation ([place])    Snoring     Past Surgical History:  Procedure Laterality Date   ATRIAL FIBRILLATION ABLATION N/A 12/11/2018   Procedure: ATRIAL FIBRILLATION ABLATION;  Surgeon: Thompson Grayer, MD;  Location: [facility] [facility];  Service: Cardiovascular;  Laterality: N/A;   CARDIOVERSION     IMAGE GUIDED SINUS SURGERY N/A 10/29/2019   Procedure: IMAGE GUIDED SINUS SURGERY;  Surgeon: Carloyn Manner, MD;  Location: [facility];  Service: ENT;  Laterality: N/A;   MAXILLARY ANTROSTOMY Right 10/29/2019   Procedure: MAXILLARY ANTROSTOMY;  Surgeon: Carloyn Manner, MD;  Location: [facility];  Service: ENT;  Laterality: Right;   TEE WITHOUT CARDIOVERSION N/A 12/11/2018   Procedure: TRANSESOPHAGEAL ECHOCARDIOGRAM (TEE);  Surgeon: Elouise Munroe, MD;  Location: [facility];  Service: Cardiology;  Laterality: N/A;    Current Medications: Current Meds  Medication Sig   apixaban (ELIQUIS) 5 MG TABS tablet Take 1 tablet (5 mg total) by mouth 2 (two) times daily.   lisinopril (ZESTRIL) 20 MG tablet Take 1 tablet (20  mg total) by mouth daily.   omeprazole (PRILOSEC) 20 MG capsule Take 20 mg by mouth daily.     Allergies:   Patient has no known allergies.   Social History   Socioeconomic History   Marital status: Married    Spouse name: Not on file   Number of children: Not on file   Years of education: Not on file   Highest education level: Not on file  Occupational History   Not on file  Tobacco Use   Smoking status: Former    Types: Cigarettes    Start date: 03/14/2017    Passive exposure: Never   Smokeless tobacco: Never  Vaping Use   Vaping Use: Never used  Substance and Sexual Activity   Alcohol use: Yes    Alcohol/week: 2.0 standard drinks of alcohol    Types: 2 Standard drinks or equivalent per week    Comment: couple daily   Drug use: No   Sexual activity: Not on file  Other Topics Concern   Not on file  Social History Narrative   Lives in [place] with spouse.   Retired Hotel manager   Social Determinants of Radio broadcast assistant Strain: [place]  (09/21/2021)   Overall Financial Resource Strain (CARDIA)    Difficulty of Paying Living Expenses: Not very hard  Food Insecurity: No Food Insecurity (09/21/2021)   Hunger Vital Sign    Worried About Running Out of Food  in the Last Year: Never true    [place] in the Last Year: Never true  Transportation Needs: No Transportation Needs (09/21/2021)   PRAPARE - Hydrologist (Medical): No    Lack of Transportation (Non-Medical): No  Physical Activity: Not on file  Stress: No Stress Concern Present (09/21/2021)   [place]    Feeling of Stress : Not at all  Social Connections: Not on file     Family History: The patient's family history includes Arthritis in his maternal grandmother; Breast cancer in his sister; Heart attack in his father.  ROS:   Please see the history of present illness.    All other systems reviewed  and are negative.  EKGs/Labs/Other Studies Reviewed:    The following studies were reviewed today:    Recent Labs: 01/10/2022: ALT 14; BUN 12; Creat 0.91; Hemoglobin 17.1; Platelets 173; Potassium 4.1; Sodium 141  Recent Lipid Panel    Component Value Date/Time   CHOL 188 01/10/2022 1034   TRIG 117 01/10/2022 1034   HDL 44 01/10/2022 1034   CHOLHDL 4.3 01/10/2022 1034   VLDL 14.4 03/19/2019 1116   LDLCALC 122 (H) 01/10/2022 1034    Physical Exam:    VS:  BP (!) 160/90   Pulse 85   Ht 5' 11"$  (1.803 m)   Wt 253 lb (114.8 kg)   SpO2 96%   BMI 35.29 kg/m     Wt Readings from Last 3 Encounters:  02/15/22 253 lb (114.8 kg)  01/10/22 254 lb (115.2 kg)  02/19/21 254 lb 9.6 oz (115.5 kg)     GEN: *** Well nourished, well developed in no acute distress CARDIAC: ***RRR, no murmurs, rubs, gallops RESPIRATORY:  Clear to auscultation without rales, wheezing or rhonchi        ASSESSMENT:    1. Paroxysmal atrial fibrillation (HCC)    PLAN:    In order of problems listed above:  #Atrial fibrillation Post ablation with [name] in 2020.  On Eliquis for stroke prophylaxis.  The patient has a strong preference to avoid long-term exposure to anticoagulation given the risks of bleeding.  He would like to consider left atrial appendage occlusion.  I have seen [name] in the office today who is being considered for a Watchman left atrial appendage closure device. I believe they will benefit from this procedure given their history of atrial fibrillation, CHA2DS2-VASc score of 3 and unadjusted ischemic stroke rate of 3.2% per year. The patient's chart has been reviewed and I feel that they would be a candidate for short term oral anticoagulation after Watchman implant.   It is my belief that after undergoing a LAA closure procedure, [name] will not need long term anticoagulation which eliminates anticoagulation side effects and major bleeding risk.   Procedural risks  for the Watchman implant have been reviewed with the patient including a 0.5% risk of stroke, <1% risk of perforation and <1% risk of device embolization. Other risks include bleeding, vascular damage, tamponade, worsening renal function, and death. The patient understands these risk and wishes to proceed.     The published clinical data on the safety and effectiveness of WATCHMAN include but are not limited to the following: - Holmes DR, Mechele Claude, Sick P et al. for the PROTECT AF Investigators. Percutaneous closure of the left atrial appendage versus warfarin therapy for prevention of stroke in patients with atrial fibrillation: a randomised  non-inferiority trial. Lancet 2009; 374: 534-42. Mechele Claude, Doshi SK, Abelardo Diesel D et al. on behalf of the PROTECT AF Investigators. Percutaneous Left Atrial Appendage Closure for Stroke Prophylaxis in Patients With Atrial Fibrillation 2.3-Year Follow-up of the PROTECT AF (Watchman Left Atrial Appendage System for Embolic Protection in Patients With Atrial Fibrillation) Trial. Circulation 2013; 127:720-729. - Alli O, Doshi S,  Kar S, Reddy VY, Sievert H et al. Quality of Life Assessment in the Randomized PROTECT AF (Percutaneous Closure of the Left Atrial Appendage Versus Warfarin Therapy for Prevention of Stroke in Patients With Atrial Fibrillation) Trial of Patients at Risk for Stroke With Nonvalvular Atrial Fibrillation. J Am Coll Cardiol 2013; N8865744. Vertell Limber DR, Tarri Abernethy, Price M, [place], Sievert H, Doshi S, Huber K, Reddy V. Prospective randomized evaluation of the Watchman left atrial appendage Device in patients with atrial fibrillation versus long-term warfarin therapy; the PREVAIL trial. Journal of the [employer] of Cardiology, Vol. 4, No. 1, 2014, 1-11. - Kar S, Doshi SK, Sadhu A, Horton R, Osorio J et al. Primary outcome evaluation of a next-generation left atrial appendage closure device: results from the PINNACLE FLX trial. Circulation  2021;143(18)1754-1762.    After today's visit with the patient which was dedicated solely for shared decision making visit regarding LAA closure device, the patient decided to proceed with the LAA appendage closure procedure scheduled to be done in the near future at [facility]. Prior to the procedure, I would like to obtain an updated TTE.  HAS-BLED score 2 Hypertension Yes  Abnormal renal and liver function (Dialysis, transplant, Cr >2.26 mg/dL /Cirrhosis or Bilirubin >2x Normal or AST/ALT/AP >3x Normal) No  Stroke No  Bleeding No  Labile INR (Unstable/high INR) No  Elderly (>65) Yes  Drugs or alcohol (? 8 drinks/week, anti-plt or NSAID) No   CHA2DS2-VASc Score = 3  The patient's score is based upon: CHF History: 0 HTN History: 1 Diabetes History: 0 Stroke History: 0 Vascular Disease History: 0 Age Score: 2 Gender Score: 0   Medication Adjustments/Labs and Tests Ordered: Current medicines are reviewed at length with the patient today.  Concerns regarding medicines are outlined above.  No orders of the defined types were placed in this encounter.  No orders of the defined types were placed in this encounter.    Signed, Lars Mage, MD, [facility], [facility] 02/15/2022 3:48 PM    Electrophysiology [facility]

## 2022-02-15 ENCOUNTER — Ambulatory Visit: Payer: Medicare HMO | Attending: Physician Assistant | Admitting: Cardiology

## 2022-02-15 ENCOUNTER — Encounter: Payer: Self-pay | Admitting: Cardiology

## 2022-02-15 VITALS — BP 160/90 | HR 85 | Ht 71.0 in | Wt 253.0 lb

## 2022-02-15 DIAGNOSIS — I10 Essential (primary) hypertension: Secondary | ICD-10-CM | POA: Diagnosis not present

## 2022-02-15 DIAGNOSIS — I480 Paroxysmal atrial fibrillation: Secondary | ICD-10-CM | POA: Diagnosis not present

## 2022-02-15 NOTE — Patient Instructions (Addendum)
Medication Instructions:  Your physician recommends that you continue on your current medications as directed. Please refer to the Current Medication list given to you today.  *If you need a refill on your cardiac medications before your next appointment, please call your pharmacy*  Labs:  TODAY: BMET   Testing/Procedures: Your physician has requested that you have an echocardiogram. Echocardiography is a painless test that uses sound waves to create images of your heart. It provides your doctor with information about the size and shape of your heart and how well your heart's chambers and valves are working. This procedure takes approximately one hour. There are no restrictions for this procedure. Please do NOT wear cologne, perfume, aftershave, or lotions (deodorant is allowed). Please arrive 15 minutes prior to your appointment time.  Your physician has requested that you have cardiac CT. Cardiac computed tomography (CT) is a painless test that uses an x-ray machine to take clear, detailed pictures of your heart. For further information please visit [email or web address]. Please follow instruction sheet as given.  Follow-Up: At [facility], you and your health needs are our priority.  As part of our continuing mission to provide you with exceptional heart care, we have created designated Provider Care Teams.  These Care Teams include your primary Cardiologist (physician) and Advanced Practice Providers (APPs -  Physician Assistants and Nurse Practitioners) who all work together to provide you with the care you need, when you need it.  Your next appointment:   After your testing is complete, you will be contacted by Nurse Navigator, Lenice Llamas to schedule your pre-procedure visit and procedure date. If you have any questions she can be reached at [phone].

## 2022-02-15 NOTE — Progress Notes (Signed)
Electrophysiology Office Follow up Visit Note:    Date:  02/15/2022   ID:  Kent James, DOB November 04, 1945, MRN [id number]  PCP:  Jearld Fenton, NP  [facility] Cardiologist:  None  [facility] Electrophysiologist:  Vickie Epley, MD    Interval History:    Kent James is a 77 y.o. male who presents for a follow up visit. They were last seen in clinic by [name] February 19, 2021.  The patient has a history of atrial fibrillation post ablation in December 2020.  He has been on Eliquis for stroke prophylaxis.  He presents today to establish EP care and to discuss left atrial appendage occlusion as a stroke risk mitigation strategy.  Today, he is feeling overall well.  He is taking eliquis, 5 mg twice daily. He denies any bleeding or falls.  He has had occasional where he cannot remember if he is taken a dose of his medication.  He notes his blood pressure tends to range between [id number] systolic.   He denies any palpitations, chest pain, shortness of breath, or peripheral edema. No lightheadedness, headaches, syncope, orthopnea, or PND.  Past Medical History:  Diagnosis Date   Childhood asthma    Essential hypertension    GERD (gastroesophageal reflux disease)    History of chicken pox    History of colon polyps    Hyperlipidemia    Persistent atrial fibrillation ([place])    Snoring     Past Surgical History:  Procedure Laterality Date   ATRIAL FIBRILLATION ABLATION N/A 12/11/2018   Procedure: ATRIAL FIBRILLATION ABLATION;  Surgeon: Thompson Grayer, MD;  Location: [facility];  Service: Cardiovascular;  Laterality: N/A;   CARDIOVERSION     IMAGE GUIDED SINUS SURGERY N/A 10/29/2019   Procedure: IMAGE GUIDED SINUS SURGERY;  Surgeon: Carloyn Manner, MD;  Location: [facility];  Service: ENT;  Laterality: N/A;   MAXILLARY ANTROSTOMY Right 10/29/2019   Procedure: MAXILLARY ANTROSTOMY;  Surgeon: Carloyn Manner, MD;  Location: [facility];  Service: ENT;  Laterality:  Right;   TEE WITHOUT CARDIOVERSION N/A 12/11/2018   Procedure: TRANSESOPHAGEAL ECHOCARDIOGRAM (TEE);  Surgeon: Elouise Munroe, MD;  Location: [place];  Service: Cardiology;  Laterality: N/A;    Current Medications: Current Meds  Medication Sig   apixaban (ELIQUIS) 5 MG TABS tablet Take 1 tablet (5 mg total) by mouth 2 (two) times daily.   lisinopril (ZESTRIL) 20 MG tablet Take 1 tablet (20 mg total) by mouth daily.   omeprazole (PRILOSEC) 20 MG capsule Take 20 mg by mouth daily.     Allergies:   Patient has no known allergies.   Social History   Socioeconomic History   Marital status: Married    Spouse name: Not on file   Number of children: Not on file   Years of education: Not on file   Highest education level: Not on file  Occupational History   Not on file  Tobacco Use   Smoking status: Former    Types: Cigarettes    Start date: 03/14/2017    Passive exposure: Never   Smokeless tobacco: Never  Vaping Use   Vaping Use: Never used  Substance and Sexual Activity   Alcohol use: Yes    Alcohol/week: 2.0 standard drinks of alcohol    Types: 2 Standard drinks or equivalent per week    Comment: couple daily   Drug use: No   Sexual activity: Not on file  Other Topics Concern   Not on file  Social History Narrative   Lives in [place] with spouse.   Retired Hotel manager   Social Determinants of Radio broadcast assistant Strain: [place]  (09/21/2021)   Overall Financial Resource Strain (CARDIA)    Difficulty of Paying Living Expenses: Not very hard  Food Insecurity: No Food Insecurity (09/21/2021)   Hunger Vital Sign    Worried About Running Out of Food in the Last Year: Never true    Ran Out of Food in the Last Year: Never true  Transportation Needs: No Transportation Needs (09/21/2021)   PRAPARE - Hydrologist (Medical): No    Lack of Transportation (Non-Medical): No  Physical Activity: Not on file  Stress: No Stress Concern  Present (09/21/2021)   [place]    Feeling of Stress : Not at all  Social Connections: Not on file     Family History: The patient's family history includes Arthritis in his maternal grandmother; Breast cancer in his sister; Heart attack in his father.  ROS:   Please see the history of present illness.    All other systems reviewed and are negative.  EKGs/Labs/Other Studies Reviewed:    The following studies were reviewed today:    Recent Labs: 01/10/2022: ALT 14; BUN 12; Creat 0.91; Hemoglobin 17.1; Platelets 173; Potassium 4.1; Sodium 141  Recent Lipid Panel    Component Value Date/Time   CHOL 188 01/10/2022 1034   TRIG 117 01/10/2022 1034   HDL 44 01/10/2022 1034   CHOLHDL 4.3 01/10/2022 1034   VLDL 14.4 03/19/2019 1116   LDLCALC 122 (H) 01/10/2022 1034    Physical Exam:    VS:  BP (!) 160/90   Pulse 85   Ht 5' 11"$  (1.803 m)   Wt 253 lb (114.8 kg)   SpO2 96%   BMI 35.29 kg/m     Wt Readings from Last 3 Encounters:  02/15/22 253 lb (114.8 kg)  01/10/22 254 lb (115.2 kg)  02/19/21 254 lb 9.6 oz (115.5 kg)     GEN:  Well nourished, well developed in no acute distress CARDIAC: RRR, no murmurs, rubs, gallops RESPIRATORY:  Clear to auscultation without rales, wheezing or rhonchi        ASSESSMENT:    1. Paroxysmal atrial fibrillation (HCC)    PLAN:    In order of problems listed above:  #Atrial fibrillation Post ablation with [name] in 2020.  On Eliquis for stroke prophylaxis.  The patient has a strong preference to avoid long-term exposure to anticoagulation given the risks of bleeding.  He has had a couple episodes as well where he has forgotten whether or not he took a dose of medication and may have missed a dose.  He would like to consider left atrial appendage occlusion.  I have seen [name] in the office today who is being considered for a Watchman left atrial appendage  closure device. I believe they will benefit from this procedure given their history of atrial fibrillation, CHA2DS2-VASc score of 3 and unadjusted ischemic stroke rate of 3.2% per year. The patient's chart has been reviewed and I feel that they would be a candidate for short term oral anticoagulation after Watchman implant.   It is my belief that after undergoing a LAA closure procedure, Hinson Vlasic will not need long term anticoagulation which eliminates anticoagulation side effects and major bleeding risk.   Procedural risks for the Watchman implant have been reviewed with the  patient including a 0.5% risk of stroke, <1% risk of perforation and <1% risk of device embolization. Other risks include bleeding, vascular damage, tamponade, worsening renal function, and death. The patient understands these risk and wishes to proceed.     The published clinical data on the safety and effectiveness of WATCHMAN include but are not limited to the following: - Holmes DR, Mechele Claude, Sick P et al. for the PROTECT AF Investigators. Percutaneous closure of the left atrial appendage versus warfarin therapy for prevention of stroke in patients with atrial fibrillation: a randomised non-inferiority trial. Lancet 2009; 374: 534-42. Mechele Claude, Doshi SK, Abelardo Diesel D et al. on behalf of the PROTECT AF Investigators. Percutaneous Left Atrial Appendage Closure for Stroke Prophylaxis in Patients With Atrial Fibrillation 2.3-Year Follow-up of the PROTECT AF (Watchman Left Atrial Appendage System for Embolic Protection in Patients With Atrial Fibrillation) Trial. Circulation 2013; 127:720-729. - Alli O, Doshi S,  Kar S, Reddy VY, Sievert H et al. Quality of Life Assessment in the Randomized PROTECT AF (Percutaneous Closure of the Left Atrial Appendage Versus Warfarin Therapy for Prevention of Stroke in Patients With Atrial Fibrillation) Trial of Patients at Risk for Stroke With Nonvalvular Atrial Fibrillation. J Am Coll  Cardiol 2013; P4788364. Vertell Limber DR, Tarri Abernethy, Price M, [place], Sievert H, Doshi S, Huber K, Reddy V. Prospective randomized evaluation of the Watchman left atrial appendage Device in patients with atrial fibrillation versus long-term warfarin therapy; the PREVAIL trial. Journal of the [employer] of Cardiology, Vol. 4, No. 1, 2014, 1-11. - Kar S, Doshi SK, Sadhu A, Horton R, Osorio J et al. Primary outcome evaluation of a next-generation left atrial appendage closure device: results from the PINNACLE FLX trial. Circulation 2021;143(18)1754-1762.    After today's visit with the patient which was dedicated solely for shared decision making visit regarding LAA closure device, the patient decided to proceed with the LAA appendage closure procedure scheduled to be done in the near future at [facility]. Prior to the procedure, I would like to obtain an updated TTE.  HAS-BLED score 2 Hypertension Yes  Abnormal renal and liver function (Dialysis, transplant, Cr >2.26 mg/dL /Cirrhosis or Bilirubin >2x Normal or AST/ALT/AP >3x Normal) No  Stroke No  Bleeding No  Labile INR (Unstable/high INR) No  Elderly (>65) Yes  Drugs or alcohol (? 8 drinks/week, anti-plt or NSAID) No   CHA2DS2-VASc Score = 3  The patient's score is based upon: CHF History: 0 HTN History: 1 Diabetes History: 0 Stroke History: 0 Vascular Disease History: 0 Age Score: 2 Gender Score: 0  #Hypertension Above goal today.  Increase lisinopril to 40 mg by mouth once daily.  Recommend checking blood pressures 1-2 times per week at home and recording the values.  Recommend bringing these recordings to the primary care physician.   Medication Adjustments/Labs and Tests Ordered: Current medicines are reviewed at length with the patient today.  Concerns regarding medicines are outlined above.  Orders Placed This Encounter  Procedures   CT CARDIAC MORPH/PULM VEIN W/CM&W/O CA SCORE   Basic metabolic panel    ECHOCARDIOGRAM COMPLETE   No orders of the defined types were placed in this encounter.   I,Rachel Rivera,acting as a scribe for Vickie Epley, MD.,have documented all relevant documentation on the behalf of Vickie Epley, MD,as directed by  Vickie Epley, MD while in the presence of Vickie Epley, MD.  I, Vickie Epley, MD, have reviewed all documentation for  this visit. The documentation on 02/15/22 for the exam, diagnosis, procedures, and orders are all accurate and complete.   Signed, Lars Mage, MD, [facility], [facility] 02/15/2022 9:03 PM    Electrophysiology [facility]

## 2022-02-16 LAB — BASIC METABOLIC PANEL
BUN/Creatinine Ratio: 13 (ref 10–24)
BUN: 13 mg/dL (ref 8–27)
CO2: 22 mmol/L (ref 20–29)
Calcium: 9.3 mg/dL (ref 8.6–10.2)
Chloride: 103 mmol/L (ref 96–106)
Creatinine, Ser: 1.04 mg/dL (ref 0.76–1.27)
Glucose: 104 mg/dL — ABNORMAL HIGH (ref 70–99)
Potassium: 4.4 mmol/L (ref 3.5–5.2)
Sodium: 141 mmol/L (ref 134–144)
eGFR: 74 mL/min/{1.73_m2} (ref >59)

## 2022-02-16 MED ORDER — LISINOPRIL 40 MG PO TABS: 40.0000 mg | ORAL_TABLET | Freq: Every day | ORAL | 3 refills | Status: DC

## 2022-02-16 NOTE — Addendum Note (Signed)
Addended by: Bernestine Amass on: 02/16/2022 07:32 AM   Modules accepted: Orders

## 2022-02-17 ENCOUNTER — Telehealth: Payer: Self-pay

## 2022-02-17 DIAGNOSIS — I480 Paroxysmal atrial fibrillation: Secondary | ICD-10-CM

## 2022-02-17 NOTE — Telephone Encounter (Signed)
cCT ordered.  Called and left message for patient that a MyChart message will be sent with CT information.

## 2022-02-17 NOTE — Telephone Encounter (Signed)
-----   Message from Vickie Epley, MD sent at 02/17/2022  5:24 PM EST ----- OK. Valetta Fuller, can we order the repeat please? Thanks, Lars Mage       ----- Message ----- From: Josue Hector, MD Sent: 02/16/2022   4:53 PM EST To: Sherren Mocha, MD; Theodoro Parma, RN; #  The study was done over 3 years ago and there was a ? Thrombus TEE did not show this  There was only one diastolic phase in PACS and measured landing zone small 13.8 mm x 11.3 mm average diameter 12.3 mm with Broccoli  morphology Depth hard to say since there was a filling defect   would repeat the study  ----- Message ----- From: Theodoro Parma, RN Sent: 02/16/2022  10:40 AM EST To: Josue Hector, MD  Hey Nish! Will you please look at this CT scan for LAAO measurements? No rush! Thank you, KK

## 2022-03-17 ENCOUNTER — Ambulatory Visit (HOSPITAL_COMMUNITY): Payer: Medicare HMO | Attending: Internal Medicine

## 2022-03-17 DIAGNOSIS — I480 Paroxysmal atrial fibrillation: Secondary | ICD-10-CM | POA: Insufficient documentation

## 2022-03-17 LAB — ECHOCARDIOGRAM COMPLETE
Area-P 1/2: 3.56 cm2
P 1/2 time: 639 msec
S' Lateral: 2.6 cm

## 2022-03-22 ENCOUNTER — Telehealth (HOSPITAL_COMMUNITY): Payer: Self-pay | Admitting: *Deleted

## 2022-03-22 NOTE — Telephone Encounter (Signed)
Reaching out to patient to offer assistance regarding upcoming cardiac imaging study; pt verbalizes understanding of appt date/time, parking situation and where to check in, pre-test NPO status verified current allergies; name and call back number provided for further questions should they arise  Kent Clement RN Navigator Cardiac Imaging [facility] [phone] office [phone] cell  Patient aware to arrive at [place].

## 2022-03-23 ENCOUNTER — Ambulatory Visit (HOSPITAL_COMMUNITY)
Admission: RE | Admit: 2022-03-23 | Discharge: 2022-03-23 | Disposition: A | Discharge status: Home / Self Care | Payer: Medicare HMO | Source: Ambulatory Visit | Attending: Cardiology | Admitting: Cardiology

## 2022-03-23 DIAGNOSIS — I480 Paroxysmal atrial fibrillation: Secondary | ICD-10-CM | POA: Insufficient documentation

## 2022-03-23 MED ORDER — IOHEXOL 350 MG/ML SOLN
100.0000 mL | Freq: Once | INTRAVENOUS | Status: AC | PRN
  Administered 2022-03-23: 100 mL via INTRAVENOUS

## 2022-03-31 ENCOUNTER — Telehealth: Payer: Self-pay

## 2022-03-31 ENCOUNTER — Other Ambulatory Visit: Payer: Self-pay

## 2022-03-31 DIAGNOSIS — I480 Paroxysmal atrial fibrillation: Secondary | ICD-10-CM

## 2022-03-31 NOTE — Telephone Encounter (Signed)
-----   Message from Vickie Epley, MD sent at 03/28/2022  3:01 PM EDT ----- OK to proceed with Watchman evaluation Can you help make sure he has a follow up with his primary cardiologist to discuss his coronary artery disease? Thanks! Lysbeth Galas ----- Message ----- From: Theodoro Parma, RN Sent: 03/28/2022  10:03 AM EDT To: Vickie Epley, MD  Not sure if this pre-LAAO CT made it to your basket for review. Thanks!

## 2022-03-31 NOTE — Telephone Encounter (Signed)
The patient wishes  to proceed with LAAO on 06/16/2022. Discussed cCT results with him in detail. He has no primary cardiologist, so he is scheduled 05/20/2022 to establish with Dr. Gasper Sells and for pre-LAAO visit. He was grateful for call and agreed with plan.

## 2022-05-13 ENCOUNTER — Telehealth: Payer: Self-pay | Admitting: Internal Medicine

## 2022-05-13 NOTE — Telephone Encounter (Signed)
Called patient to schedule Medicare Annual Wellness Visit (AWV). No voicemail available to leave a message.  Last date of AWV: 03/19/19  Please schedule an appointment at any time with Kennedy Bucker, LPN  .  If any questions, please contact me.  Thank you ,  Verlee Rossetti; Care Guide Ambulatory Clinical Support [place] [facility] Direct Dial: [phone]

## 2022-05-20 ENCOUNTER — Encounter: Payer: Self-pay | Admitting: Internal Medicine

## 2022-05-20 ENCOUNTER — Ambulatory Visit: Payer: Medicare HMO | Attending: Internal Medicine | Admitting: Internal Medicine

## 2022-05-20 VITALS — BP 120/60 | HR 83 | Ht 70.0 in | Wt 247.0 lb

## 2022-05-20 DIAGNOSIS — I700 Atherosclerosis of aorta: Secondary | ICD-10-CM | POA: Diagnosis not present

## 2022-05-20 DIAGNOSIS — Z6835 Body mass index (BMI) 35.0-35.9, adult: Secondary | ICD-10-CM

## 2022-05-20 DIAGNOSIS — I2584 Coronary atherosclerosis due to calcified coronary lesion: Secondary | ICD-10-CM | POA: Diagnosis not present

## 2022-05-20 DIAGNOSIS — E782 Mixed hyperlipidemia: Secondary | ICD-10-CM | POA: Insufficient documentation

## 2022-05-20 DIAGNOSIS — E7849 Other hyperlipidemia: Secondary | ICD-10-CM

## 2022-05-20 DIAGNOSIS — Z01812 Encounter for preprocedural laboratory examination: Secondary | ICD-10-CM | POA: Diagnosis not present

## 2022-05-20 DIAGNOSIS — I2510 Atherosclerotic heart disease of native coronary artery without angina pectoris: Secondary | ICD-10-CM

## 2022-05-20 DIAGNOSIS — I480 Paroxysmal atrial fibrillation: Secondary | ICD-10-CM | POA: Diagnosis not present

## 2022-05-20 LAB — BASIC METABOLIC PANEL
BUN/Creatinine Ratio: 16 (ref 10–24)
BUN: 15 mg/dL (ref 8–27)
CO2: 26 mmol/L (ref 20–29)
Calcium: 9.3 mg/dL (ref 8.6–10.2)
Chloride: 111 mmol/L — ABNORMAL HIGH (ref 96–106)
Creatinine, Ser: 0.94 mg/dL (ref 0.76–1.27)
Glucose: 116 mg/dL — ABNORMAL HIGH (ref 70–99)
Potassium: 4.5 mmol/L (ref 3.5–5.2)
Sodium: 147 mmol/L — ABNORMAL HIGH (ref 134–144)
eGFR: 84 mL/min/{1.73_m2} (ref >59)

## 2022-05-20 LAB — CBC
Hematocrit: 49.9 % (ref 37.5–51.0)
Hemoglobin: 17.1 g/dL (ref 13.0–17.7)
MCH: 30.9 pg (ref 26.6–33.0)
MCHC: 34.3 g/dL (ref 31.5–35.7)
MCV: 90 fL (ref 79–97)
Platelets: 196 10*3/uL (ref 150–450)
RBC: 5.53 x10E6/uL (ref 4.14–5.80)
RDW: 14.3 % (ref 11.6–15.4)
WBC: 7.8 10*3/uL (ref 3.4–10.8)

## 2022-05-20 MED ORDER — ROSUVASTATIN CALCIUM 5 MG PO TABS: 5.0000 mg | ORAL_TABLET | Freq: Every day | ORAL | 3 refills | Status: DC

## 2022-05-20 NOTE — Progress Notes (Signed)
Cardiology Office Note:    Date:  05/20/2022   ID:  Kent James, DOB 1945/06/11, MRN [id number]  PCP:  Lorre Munroe, NP   [facility] Providers Cardiologist:  Christell Constant, MD Electrophysiologist:  Lanier Prude, MD     Referring MD: Lorre Munroe, NP   CC: pre-procedure eval Consulted for the evaluation of CAC and LAA anatomy at the behest of Dr. Lalla Brothers  History of Present Illness:    Kent James is a 77 y.o. male with a hx of HTN, Persistent AF and HLD.Former Smoker.   2020: Established with Dr. Bjorn Pippin for AF eval.  Transitioned to EP service. [place]ion end of 2020. 2024: Has had medication issues with adherence to medications.  Patient notes that he is doing well.    No chest pain or pressure .  No SOB/DOE and no PND/Orthopnea.  No weight gain or leg swelling.  No palpitations or syncope .  No bleeding. No bruising. Notes that he has difficulty paying for eliquis due to finances also with her wife who is on an expensive medication.   Past Medical History:  Diagnosis Date   Childhood asthma    Essential hypertension    GERD (gastroesophageal reflux disease)    History of chicken pox    History of colon polyps    Hyperlipidemia    Persistent atrial fibrillation (HCC)    Snoring     Past Surgical History:  Procedure Laterality Date   ATRIAL FIBRILLATION ABLATION N/A 12/11/2018   Procedure: ATRIAL FIBRILLATION ABLATION;  Surgeon: Hillis Range, MD;  [facility];  Service: Cardiovascular;  Laterality: N/A;   CARDIOVERSION     IMAGE GUIDED SINUS SURGERY N/A 10/29/2019   Procedure: IMAGE GUIDED SINUS SURGERY;  Surgeon: Bud Face, MD;  Location: [facility] ORS;  Service: ENT;  Laterality: N/A;   MAXILLARY ANTROSTOMY Right 10/29/2019   Procedure: MAXILLARY ANTROSTOMY;  Surgeon: Bud Face, MD;  Location: [facility] ORS;  Service: ENT;  Laterality: Right;   TEE WITHOUT CARDIOVERSION N/A 12/11/2018   Procedure:  TRANSESOPHAGEAL ECHOCARDIOGRAM (TEE);  Surgeon: Parke Poisson, MD;  Location: [facility] ENDOSCOPY;  Service: Cardiology;  Laterality: N/A;    Current Medications: Current Meds  Medication Sig   apixaban (ELIQUIS) 5 MG TABS tablet Take 1 tablet (5 mg total) by mouth 2 (two) times daily.   lisinopril (ZESTRIL) 40 MG tablet Take 1 tablet (40 mg total) by mouth daily.   omeprazole (PRILOSEC) 20 MG capsule Take 20 mg by mouth daily.   rosuvastatin (CRESTOR) 5 MG tablet Take 1 tablet (5 mg total) by mouth daily.     Allergies:   Patient has no known allergies.   Social History   Socioeconomic History   Marital status: Married    Spouse name: Not on file   Number of children: Not on file   Years of education: Not on file   Highest education level: Not on file  Occupational History   Not on file  Tobacco Use   Smoking status: Former    Types: Cigarettes    Start date: 03/14/2017    Passive exposure: Never   Smokeless tobacco: Never  Vaping Use   Vaping Use: Never used  Substance and Sexual Activity   Alcohol use: Yes    Alcohol/week: 2.0 standard drinks of alcohol    Types: 2 Standard drinks or equivalent per week    Comment: couple daily   Drug use: No   Sexual activity: Not on  file  Other Topics Concern   Not on file  Social History Narrative   Lives in [place] with spouse.   Retired Medical illustrator   Social Determinants of Corporate investment banker Strain: Low Risk  (09/21/2021)   Overall Financial Resource Strain (CARDIA)    Difficulty of Paying Living Expenses: Not very hard  Food Insecurity: No Food Insecurity (09/21/2021)   Hunger Vital Sign    Worried About Running Out of Food in the Last Year: Never true    Ran Out of Food in the Last Year: Never true  Transportation Needs: No Transportation Needs (09/21/2021)   PRAPARE - Administrator, Civil Service (Medical): No    Lack of Transportation (Non-Medical): No  Physical Activity: Not on file  Stress: No  Stress Concern Present (09/21/2021)   [employer] of Occupational Health - Occupational Stress Questionnaire    Feeling of Stress : Not at all  Social Connections: Not on file     Family History: The patient's family history includes Arthritis in his maternal grandmother; Breast cancer in his sister; Heart attack in his father.  ROS:   Please see the history of present illness.     All other systems reviewed and are negative.  EKGs/Labs/Other Studies Reviewed:    The following studies were reviewed today:  EKG:  EKG is  ordered today.  The ekg ordered today demonstrates  05/20/2022 SR rate 83 possible inferior infarct pattern  Recent Labs: 01/10/2022: ALT 14; Hemoglobin 17.1; Platelets 173 02/15/2022: BUN 13; Creatinine, Ser 1.04; Potassium 4.4; Sodium 141  Recent Lipid Panel    Component Value Date/Time   CHOL 188 01/10/2022 1034   TRIG 117 01/10/2022 1034   HDL 44 01/10/2022 1034   CHOLHDL 4.3 01/10/2022 1034   VLDL 14.4 03/19/2019 1116   LDLCALC 122 (H) 01/10/2022 1034        Physical Exam:    VS:  BP 120/60   Pulse 83   Ht 5\' 10"  (1.778 m)   Wt 247 lb (112 kg)   SpO2 94%   BMI 35.44 kg/m     Wt Readings from Last 3 Encounters:  05/20/22 247 lb (112 kg)  02/15/22 253 lb (114.8 kg)  01/10/22 254 lb (115.2 kg)     GEN: Morbid obesity no acute distress HEENT:R ear Frank's sign NECK: No JVD CARDIAC: RRR, no murmurs, rubs, gallops RESPIRATORY:  Clear to auscultation without rales, wheezing or rhonchi  ABDOMEN: Soft, non-tender, non-distended MUSCULOSKELETAL:  No edema; No deformity  SKIN: Warm and dry NEUROLOGIC:  Alert and oriented x 3 PSYCHIATRIC:  Normal affect   ASSESSMENT:    1. PAF (paroxysmal atrial fibrillation) (HCC)   2. Other hyperlipidemia   3. Pre-procedure lab exam   4. Mixed hyperlipidemia   5. Aortic atherosclerosis (HCC)   6. Coronary artery calcification    PLAN:    [facility] Referral for Left Atrial Appendage  Closure with Non-Valvular Atrial Fibrillation   Kent James is a 77 y.o. male is being referred to the [facility] Team for evaluation for Left Atrial Appendage Closure with Watchman device for the management of stroke risk resulting form non-valvular atrial fibrillation.    Base upon Kent James history, he is felt to be a poor candidate for long-term anticoagulation because of  unable to reliably take medication long term due to cost .  The patient has a HAS-BLED score of   indicating a Yearly Major Bleeding Risk of  %.  His CHADS2-VASc Score is 3 with an unadjusted Ischemic Stroke Rate (% per year) of 3.2%.    His stroke risk necessitates a strategy of stroke prevention with either long-term oral anticoagulation or left atrial appendage occlusion therapy. We have discussed their bleeding risk in the context of their comorbid medical problems, as well as the rationale for referral for evaluation of Watchman left atrial appendage occlusion therapy. While the patient is at high long-term bleeding risk, they may be appropriate for short-term anticoagulation. Based on this individual patient's stroke and bleeding risk, a shared decision has been made to refer the patient for consideration of Watchman left atrial appendage closure utilizing the [employer] of Cardiology shared decision tool.  HLD (mixed) Aortic atherosclerosis CAC with elevated score Morbid obesity - reviewed CT with patient - will start rosuvastatin 5 mg and labs in three months  HTN -controlled on lisinopril           Medication Adjustments/Labs and Tests Ordered: Current medicines are reviewed at length with the patient today.  Concerns regarding medicines are outlined above.  Orders Placed This Encounter  Procedures   Basic metabolic panel   CBC   Lipid panel   ALT   EKG 12-Lead   Meds ordered this encounter  Medications   rosuvastatin (CRESTOR) 5 MG tablet    Sig: Take 1  tablet (5 mg total) by mouth daily.    Dispense:  90 tablet    Refill:  3    Patient Instructions  Medication Instructions:  Your physician has recommended you make the following change in your medication:   1) START rosuvastatin (Crestor) 5mg  daily  *If you need a refill on your cardiac medications before your next appointment, please call your pharmacy*  Lab Work: TODAY: BMET, CBC In 3 months: fasting Lipid and ALT (please call our office to schedule lab appointment) If you have labs (blood work) drawn today and your tests are completely normal, you will receive your results only by: MyChart Message (if you have MyChart) OR A paper copy in the mail If you have any lab test that is abnormal or we need to change your treatment, we will call you to review the results.  Testing/Procedures: None ordered  Follow-Up: At [facility], you and your health needs are our priority.  As part of our continuing mission to provide you with exceptional heart care, we have created designated Provider Care Teams.  These Care Teams include your primary Cardiologist (physician) and Advanced Practice Providers (APPs -  Physician Assistants and Nurse Practitioners) who all work together to provide you with the care you need, when you need it.  Your next appointment:   1 year(s)  The format for your next appointment:   In Person  Provider:   Christell Constant, MD {   Signed, Christell Constant, MD  05/20/2022 11:57 AM    [facility]

## 2022-05-20 NOTE — Patient Instructions (Signed)
Medication Instructions:  Your physician has recommended you make the following change in your medication:   1) START rosuvastatin (Crestor) 5mg  daily  *If you need a refill on your cardiac medications before your next appointment, please call your pharmacy*  Lab Work: TODAY: BMET, CBC In 3 months: fasting Lipid and ALT (please call our office to schedule lab appointment) If you have labs (blood work) drawn today and your tests are completely normal, you will receive your results only by: MyChart Message (if you have MyChart) OR A paper copy in the mail If you have any lab test that is abnormal or we need to change your treatment, we will call you to review the results.  Testing/Procedures: None ordered  Follow-Up: At [facility], you and your health needs are our priority.  As part of our continuing mission to provide you with exceptional heart care, we have created designated Provider Care Teams.  These Care Teams include your primary Cardiologist (physician) and Advanced Practice Providers (APPs -  Physician Assistants and Nurse Practitioners) who all work together to provide you with the care you need, when you need it.  Your next appointment:   1 year(s)  The format for your next appointment:   In Person  Provider:   Christell Constant, MD {

## 2022-06-02 ENCOUNTER — Telehealth: Payer: Self-pay

## 2022-06-02 NOTE — Telephone Encounter (Signed)
The patient understands his procedure may be moved to 06/15/2022 due to Cath Lab schedule changes.  He said he can do either day (6/12 or 6/13).  He understands he will be called once verification has been received to confirm date. He was grateful for call and agreed with plan.

## 2022-06-08 NOTE — Telephone Encounter (Signed)
Received confirmation from the Cath Lab that 06/16/22 cases will need to be rescheduled to 06/15/2022. The patient agreed with plan. Updated instruction letter sent via MyChart.

## 2022-06-14 ENCOUNTER — Telehealth: Payer: Self-pay

## 2022-06-14 NOTE — Telephone Encounter (Signed)
Confirmed procedure date of 06/15/2022. Confirmed arrival time of 0630 for procedure time at 0830. Medication instructions reviewed. Reviewed pre-procedure instructions with patient. The patient understands to call if questions/concerns arise prior to procedure. He was grateful for call and agreed with plan.

## 2022-06-15 ENCOUNTER — Other Ambulatory Visit: Payer: Self-pay

## 2022-06-15 ENCOUNTER — Inpatient Hospital Stay (HOSPITAL_COMMUNITY): Payer: Medicare HMO

## 2022-06-15 ENCOUNTER — Inpatient Hospital Stay (HOSPITAL_COMMUNITY): Payer: Medicare HMO | Admitting: Certified Registered"

## 2022-06-15 ENCOUNTER — Encounter (HOSPITAL_COMMUNITY)
Admission: RE | Disposition: A | Discharge status: Home / Self Care | Payer: Self-pay | Source: Ambulatory Visit | Attending: Cardiology

## 2022-06-15 ENCOUNTER — Encounter (HOSPITAL_COMMUNITY): Payer: Self-pay | Admitting: Cardiology

## 2022-06-15 ENCOUNTER — Inpatient Hospital Stay (HOSPITAL_COMMUNITY)
Admission: RE | Admit: 2022-06-15 | Discharge: 2022-06-15 | DRG: 274 | Disposition: A | Discharge status: Home / Self Care | Payer: Medicare HMO | Source: Ambulatory Visit | Attending: Cardiology | Admitting: Cardiology

## 2022-06-15 ENCOUNTER — Encounter: Payer: Self-pay | Admitting: Cardiology

## 2022-06-15 DIAGNOSIS — E6609 Other obesity due to excess calories: Secondary | ICD-10-CM

## 2022-06-15 DIAGNOSIS — K219 Gastro-esophageal reflux disease without esophagitis: Secondary | ICD-10-CM | POA: Diagnosis present

## 2022-06-15 DIAGNOSIS — J45909 Unspecified asthma, uncomplicated: Secondary | ICD-10-CM

## 2022-06-15 DIAGNOSIS — Z79899 Other long term (current) drug therapy: Secondary | ICD-10-CM | POA: Diagnosis not present

## 2022-06-15 DIAGNOSIS — Z87891 Personal history of nicotine dependence: Secondary | ICD-10-CM

## 2022-06-15 DIAGNOSIS — I480 Paroxysmal atrial fibrillation: Secondary | ICD-10-CM

## 2022-06-15 DIAGNOSIS — Z95818 Presence of other cardiac implants and grafts: Principal | ICD-10-CM

## 2022-06-15 DIAGNOSIS — I2510 Atherosclerotic heart disease of native coronary artery without angina pectoris: Secondary | ICD-10-CM | POA: Diagnosis present

## 2022-06-15 DIAGNOSIS — I4891 Unspecified atrial fibrillation: Secondary | ICD-10-CM | POA: Diagnosis not present

## 2022-06-15 DIAGNOSIS — Z8249 Family history of ischemic heart disease and other diseases of the circulatory system: Secondary | ICD-10-CM | POA: Diagnosis not present

## 2022-06-15 DIAGNOSIS — I700 Atherosclerosis of aorta: Secondary | ICD-10-CM | POA: Diagnosis present

## 2022-06-15 DIAGNOSIS — Z7901 Long term (current) use of anticoagulants: Secondary | ICD-10-CM

## 2022-06-15 DIAGNOSIS — Z006 Encounter for examination for normal comparison and control in clinical research program: Secondary | ICD-10-CM

## 2022-06-15 DIAGNOSIS — E7800 Pure hypercholesterolemia, unspecified: Secondary | ICD-10-CM | POA: Diagnosis present

## 2022-06-15 DIAGNOSIS — I10 Essential (primary) hypertension: Secondary | ICD-10-CM | POA: Diagnosis not present

## 2022-06-15 DIAGNOSIS — I4819 Other persistent atrial fibrillation: Secondary | ICD-10-CM | POA: Diagnosis not present

## 2022-06-15 DIAGNOSIS — Z803 Family history of malignant neoplasm of breast: Secondary | ICD-10-CM | POA: Diagnosis not present

## 2022-06-15 DIAGNOSIS — I482 Chronic atrial fibrillation, unspecified: Secondary | ICD-10-CM | POA: Diagnosis not present

## 2022-06-15 DIAGNOSIS — K449 Diaphragmatic hernia without obstruction or gangrene: Secondary | ICD-10-CM | POA: Diagnosis not present

## 2022-06-15 DIAGNOSIS — E785 Hyperlipidemia, unspecified: Secondary | ICD-10-CM | POA: Diagnosis present

## 2022-06-15 DIAGNOSIS — Z8261 Family history of arthritis: Secondary | ICD-10-CM

## 2022-06-15 DIAGNOSIS — Z6835 Body mass index (BMI) 35.0-35.9, adult: Secondary | ICD-10-CM | POA: Diagnosis not present

## 2022-06-15 HISTORY — PX: TEE WITHOUT CARDIOVERSION: SHX5443

## 2022-06-15 HISTORY — PX: LEFT ATRIAL APPENDAGE OCCLUSION: EP1229

## 2022-06-15 HISTORY — DX: Presence of other cardiac implants and grafts: Z95.818

## 2022-06-15 LAB — TYPE AND SCREEN
ABO/RH(D): B POS
Unit division: 0

## 2022-06-15 LAB — BPAM RBC
Blood Product Expiration Date: 202407022359
Blood Product Expiration Date: 202407032359
Unit Type and Rh: 7300

## 2022-06-15 LAB — SURGICAL PCR SCREEN
MRSA, PCR: NEGATIVE
Staphylococcus aureus: NEGATIVE

## 2022-06-15 LAB — ECHO TEE
AV Mean grad: 2 mmHg
AV Peak grad: 5.1 mmHg
Ao pk vel: 1.13 m/s

## 2022-06-15 LAB — ABO/RH: ABO/RH(D): B POS

## 2022-06-15 SURGERY — LEFT ATRIAL APPENDAGE OCCLUSION
Anesthesia: General

## 2022-06-15 MED ORDER — CHLORHEXIDINE GLUCONATE 0.12 % MT SOLN
OROMUCOSAL | Status: AC
  Administered 2022-06-15: 15 mL
  Filled 2022-06-15: qty 15

## 2022-06-15 MED ORDER — LACTATED RINGERS IV SOLN: INTRAVENOUS | Status: DC | PRN

## 2022-06-15 MED ORDER — ONDANSETRON HCL 4 MG/2ML IJ SOLN: 4.0000 mg | Freq: Four times a day (QID) | INTRAMUSCULAR | Status: DC | PRN

## 2022-06-15 MED ORDER — LIDOCAINE 2% (20 MG/ML) 5 ML SYRINGE
INTRAMUSCULAR | Status: DC | PRN
  Administered 2022-06-15: 60 mg via INTRAVENOUS

## 2022-06-15 MED ORDER — ONDANSETRON HCL 4 MG/2ML IJ SOLN
INTRAMUSCULAR | Status: DC | PRN
  Administered 2022-06-15: 4 mg via INTRAVENOUS

## 2022-06-15 MED ORDER — CEFAZOLIN SODIUM-DEXTROSE 2-4 GM/100ML-% IV SOLN
INTRAVENOUS | Status: AC
  Filled 2022-06-15: qty 100

## 2022-06-15 MED ORDER — ROCURONIUM BROMIDE 10 MG/ML (PF) SYRINGE
PREFILLED_SYRINGE | INTRAVENOUS | Status: DC | PRN
  Administered 2022-06-15: 70 mg via INTRAVENOUS

## 2022-06-15 MED ORDER — SODIUM CHLORIDE 0.9% FLUSH: 3.0000 mL | INTRAVENOUS | Status: DC | PRN

## 2022-06-15 MED ORDER — IOHEXOL 350 MG/ML SOLN
INTRAVENOUS | Status: DC | PRN
  Administered 2022-06-15: 10 mL
  Administered 2022-06-15: 5 mL

## 2022-06-15 MED ORDER — FENTANYL CITRATE (PF) 100 MCG/2ML IJ SOLN
INTRAMUSCULAR | Status: DC | PRN
  Administered 2022-06-15: 50 ug via INTRAVENOUS

## 2022-06-15 MED ORDER — ACETAMINOPHEN 325 MG PO TABS: 650.0000 mg | ORAL_TABLET | ORAL | Status: DC | PRN

## 2022-06-15 MED ORDER — SODIUM CHLORIDE 0.9 % IV SOLN: INTRAVENOUS | Status: DC

## 2022-06-15 MED ORDER — PROPOFOL 10 MG/ML IV BOLUS
INTRAVENOUS | Status: DC | PRN
  Administered 2022-06-15: 160 ug via INTRAVENOUS

## 2022-06-15 MED ORDER — CEFAZOLIN SODIUM-DEXTROSE 2-4 GM/100ML-% IV SOLN
2.0000 g | INTRAVENOUS | Status: AC
  Administered 2022-06-15: 2 g via INTRAVENOUS

## 2022-06-15 MED ORDER — SODIUM CHLORIDE 0.9% FLUSH: 3.0000 mL | Freq: Two times a day (BID) | INTRAVENOUS | Status: DC

## 2022-06-15 MED ORDER — SODIUM CHLORIDE 0.9 % IV SOLN: 250.0000 mL | INTRAVENOUS | Status: DC | PRN

## 2022-06-15 MED ORDER — HEPARIN (PORCINE) IN NACL 2000-0.9 UNIT/L-% IV SOLN
INTRAVENOUS | Status: DC | PRN
  Administered 2022-06-15: 1000 mL

## 2022-06-15 MED ORDER — HEPARIN SODIUM (PORCINE) 1000 UNIT/ML IJ SOLN
INTRAMUSCULAR | Status: DC | PRN
  Administered 2022-06-15: 16000 [IU] via INTRAVENOUS

## 2022-06-15 MED ORDER — SUGAMMADEX SODIUM 200 MG/2ML IV SOLN
INTRAVENOUS | Status: DC | PRN
  Administered 2022-06-15: 200 mg via INTRAVENOUS

## 2022-06-15 MED ORDER — DEXAMETHASONE SODIUM PHOSPHATE 10 MG/ML IJ SOLN
INTRAMUSCULAR | Status: DC | PRN
  Administered 2022-06-15: 8 mg via INTRAVENOUS

## 2022-06-15 MED ORDER — CHLORHEXIDINE GLUCONATE 4 % EX LIQD
Freq: Once | CUTANEOUS | Status: DC
  Filled 2022-06-15: qty 15

## 2022-06-15 MED ORDER — HEPARIN (PORCINE) IN NACL 1000-0.9 UT/500ML-% IV SOLN
INTRAVENOUS | Status: DC | PRN
  Administered 2022-06-15: 500 mL

## 2022-06-15 MED ORDER — PROTAMINE SULFATE 10 MG/ML IV SOLN
INTRAVENOUS | Status: DC | PRN
  Administered 2022-06-15: 35 mg via INTRAVENOUS

## 2022-06-15 SURGICAL SUPPLY — 20 items
BLANKET WARM UNDERBOD FULL ACC (MISCELLANEOUS) ×1 IMPLANT
CATH INFINITI 5FR ANG PIGTAIL (CATHETERS) IMPLANT
CLOSURE PERCLOSE PROSTYLE (VASCULAR PRODUCTS) IMPLANT
DEVICE WATCHMAN FLX PRO PROC (KITS) IMPLANT
DEVICE WATCHMAN FXD CRV PROC (KITS) IMPLANT
DILATOR VESSEL 10FR 20CM (INTRODUCER) IMPLANT
KIT HEART LEFT (KITS) ×1 IMPLANT
KIT SHEA VERSACROSS LAAC CONNE (KITS) IMPLANT
PACK CARDIAC CATHETERIZATION (CUSTOM PROCEDURE TRAY) ×1 IMPLANT
PAD DEFIB RADIO PHYSIO CONN (PAD) ×1 IMPLANT
SHEATH PERFORMER 16FR 30 (SHEATH) IMPLANT
SHEATH PINNACLE 8F 10CM (SHEATH) IMPLANT
SHEATH PROBE COVER 6X72 (BAG) ×1 IMPLANT
SYS WATCHMAN FXD DBL (SHEATH) ×1
SYSTEM WATCHMAN FXD DBL (SHEATH) IMPLANT
TRANSDUCER W/STOPCOCK (MISCELLANEOUS) ×1 IMPLANT
TUBING CIL FLEX 10 FLL-RA (TUBING) ×1 IMPLANT
WATCHMAN FLX PRO 24 (Prosthesis & Implant Heart) IMPLANT
WATCHMAN FLX PRO PROCEDURE (KITS) ×1 IMPLANT
WATCHMAN FXD CRV SYS PROCEDURE (KITS) ×1 IMPLANT

## 2022-06-15 NOTE — Anesthesia Procedure Notes (Signed)
Procedure Name: Intubation Date/Time: 06/15/2022 8:53 AM  Performed by: De Nurse, CRNAPre-anesthesia Checklist: Patient identified, Emergency Drugs available, Suction available and Patient being monitored Patient Re-evaluated:Patient Re-evaluated prior to induction Oxygen Delivery Method: Circle System Utilized Preoxygenation: Pre-oxygenation with 100% oxygen Induction Type: IV induction Ventilation: Mask ventilation without difficulty Laryngoscope Size: Mac and 4 Grade View: Grade I Tube type: Oral Tube size: 7.5 mm Number of attempts: 1 Airway Equipment and Method: Stylet and Oral airway Placement Confirmation: ETT inserted through vocal cords under direct vision, positive ETCO2 and breath sounds checked- equal and bilateral Secured at: 20 cm Tube secured with: Tape Dental Injury: Teeth and Oropharynx as per pre-operative assessment

## 2022-06-15 NOTE — H&P (Signed)
Electrophysiology Office Follow up Visit Note:     Date:  06/15/2022    ID:  Kent James, DOB 28-Sep-1945, MRN [id number]   PCP:  Lorre Munroe, NP     [facility] Cardiologist:  None  [facility] Electrophysiologist:  Lanier Prude, MD      Interval History:     Kent James is a 77 y.o. male who presents for a follow up visit. They were last seen in clinic by Kent James February 19, 2021.  The patient has a history of atrial fibrillation post ablation in December 2020.  He has been on Eliquis for stroke prophylaxis.  He presents today to establish EP care and to discuss left atrial appendage occlusion as a stroke risk mitigation strategy.   Today, he is feeling overall well.  He is taking eliquis, 5 mg twice daily. He denies any bleeding or falls.  He has had occasional where he cannot remember if he is taken a dose of his medication.   He notes his blood pressure tends to range between 147-152 systolic.    He denies any palpitations, chest pain, shortness of breath, or peripheral edema. No lightheadedness, headaches, syncope, orthopnea, or PND.   Presents for LAAO today. Procedure reviewed.        Past Medical History:  Diagnosis Date   Childhood asthma     Essential hypertension     GERD (gastroesophageal reflux disease)     History of chicken pox     History of colon polyps     Hyperlipidemia     Persistent atrial fibrillation (HCC)     Snoring             Past Surgical History:  Procedure Laterality Date   ATRIAL FIBRILLATION ABLATION N/A 12/11/2018    Procedure: ATRIAL FIBRILLATION ABLATION;  Surgeon: Hillis Range, MD;  Location: [facility] INVASIVE CV LAB;  Service: Cardiovascular;  Laterality: N/A;   CARDIOVERSION       IMAGE GUIDED SINUS SURGERY N/A 10/29/2019    Procedure: IMAGE GUIDED SINUS SURGERY;  Surgeon: Bud Face, MD;  Location: [facility] ORS;  Service: ENT;  Laterality: N/A;   MAXILLARY ANTROSTOMY Right 10/29/2019    Procedure: MAXILLARY  ANTROSTOMY;  Surgeon: Bud Face, MD;  Location: [facility] ORS;  Service: ENT;  Laterality: Right;   TEE WITHOUT CARDIOVERSION N/A 12/11/2018    Procedure: TRANSESOPHAGEAL ECHOCARDIOGRAM (TEE);  Surgeon: Parke Poisson, MD;  Location: [facility] ENDOSCOPY;  Service: Cardiology;  Laterality: N/A;      Current Medications: Active Medications      Current Meds  Medication Sig   apixaban (ELIQUIS) 5 MG TABS tablet Take 1 tablet (5 mg total) by mouth 2 (two) times daily.   lisinopril (ZESTRIL) 20 MG tablet Take 1 tablet (20 mg total) by mouth daily.   omeprazole (PRILOSEC) 20 MG capsule Take 20 mg by mouth daily.        Allergies:   Patient has no known allergies.    Social History         Socioeconomic History   Marital status: Married      Spouse name: Not on file   Number of children: Not on file   Years of education: Not on file   Highest education level: Not on file  Occupational History   Not on file  Tobacco Use   Smoking status: Former      Types: Cigarettes      Start date: 03/14/2017      Passive  exposure: Never   Smokeless tobacco: Never  Vaping Use   Vaping Use: Never used  Substance and Sexual Activity   Alcohol use: Yes      Alcohol/week: 2.0 standard drinks of alcohol      Types: 2 Standard drinks or equivalent per week      Comment: couple daily   Drug use: No   Sexual activity: Not on file  Other Topics Concern   Not on file  Social History Narrative    Lives in [place] with spouse.    Retired Medical illustrator    Social Determinants of Acupuncturist Strain: Low Risk  (09/21/2021)    Overall Financial Resource Strain (CARDIA)     Difficulty of Paying Living Expenses: Not very hard  Food Insecurity: No Food Insecurity (09/21/2021)    Hunger Vital Sign     Worried About Running Out of Food in the Last Year: Never true     Ran Out of Food in the Last Year: Never true  Transportation Needs: No Transportation Needs (09/21/2021)    PRAPARE -  Therapist, art (Medical): No     Lack of Transportation (Non-Medical): No  Physical Activity: Not on file  Stress: No Stress Concern Present (09/21/2021)    [employer] of Occupational Health - Occupational Stress Questionnaire     Feeling of Stress : Not at all  Social Connections: Not on file      Family History: The patient's family history includes Arthritis in his maternal grandmother; Breast cancer in his sister; Heart attack in his father.   ROS:   Please see the history of present illness.    All other systems reviewed and are negative.   EKGs/Labs/Other Studies Reviewed:     The following studies were reviewed today:       Recent Labs: 01/10/2022: ALT 14; BUN 12; Creat 0.91; Hemoglobin 17.1; Platelets 173; Potassium 4.1; Sodium 141  Recent Lipid Panel Labs (Brief)          Component Value Date/Time    CHOL 188 01/10/2022 1034    TRIG 117 01/10/2022 1034    HDL 44 01/10/2022 1034    CHOLHDL 4.3 01/10/2022 1034    VLDL 14.4 03/19/2019 1116    LDLCALC 122 (H) 01/10/2022 1034        Physical Exam:     VS:  BP 154/88   Pulse 70   Ht 5\' 11"  (1.803 m)   Wt 253 lb (114.8 kg)   SpO2 96%   BMI 35.29 kg/m         Wt Readings from Last 3 Encounters:  02/15/22 253 lb (114.8 kg)  01/10/22 254 lb (115.2 kg)  02/19/21 254 lb 9.6 oz (115.5 kg)      GEN:  Well nourished, well developed in no acute distress CARDIAC: RRR, no murmurs, rubs, gallops RESPIRATORY:  Clear to auscultation without rales, wheezing or rhonchi          Assessment ASSESSMENT:     1. Paroxysmal atrial fibrillation (HCC)     PLAN:     In order of problems listed above:   #Atrial fibrillation Post ablation with Kent James in 2020.  On Eliquis for stroke prophylaxis.  The patient has a strong preference to avoid long-term exposure to anticoagulation given the risks of bleeding.  He has had a couple episodes as well where he has forgotten whether or not he  took  a dose of medication and may have missed a dose.  He would like to consider left atrial appendage occlusion.   I have seen Kent James in the office today who is being considered for a Watchman left atrial appendage closure device. I believe they will benefit from this procedure given their history of atrial fibrillation, CHA2DS2-VASc score of 3 and unadjusted ischemic stroke rate of 3.2% per year. The patient's chart has been reviewed and I feel that they would be a candidate for short term oral anticoagulation after Watchman implant.    It is my belief that after undergoing a LAA closure procedure, Kent James will not need long term anticoagulation which eliminates anticoagulation side effects and major bleeding risk.    Procedural risks for the Watchman implant have been reviewed with the patient including a 0.5% risk of stroke, <1% risk of perforation and <1% risk of device embolization. Other risks include bleeding, vascular damage, tamponade, worsening renal function, and death. The patient understands these risk and wishes to proceed.       The published clinical data on the safety and effectiveness of WATCHMAN include but are not limited to the following: - Holmes DR, Everlene Farrier, Sick P et al. for the PROTECT AF Investigators. Percutaneous closure of the left atrial appendage versus warfarin therapy for prevention of stroke in patients with atrial fibrillation: a randomised non-inferiority trial. Lancet 2009; 374: 534-42. Everlene Farrier, Doshi SK, Isa Rankin D et al. on behalf of the PROTECT AF Investigators. Percutaneous Left Atrial Appendage Closure for Stroke Prophylaxis in Patients With Atrial Fibrillation 2.3-Year Follow-up of the PROTECT AF (Watchman Left Atrial Appendage System for Embolic Protection in Patients With Atrial Fibrillation) Trial. Circulation 2013; 127:720-729. - Alli O, Doshi S,  Kar S, Reddy VY, Sievert H et al. Quality of Life Assessment in the Randomized  PROTECT AF (Percutaneous Closure of the Left Atrial Appendage Versus Warfarin Therapy for Prevention of Stroke in Patients With Atrial Fibrillation) Trial of Patients at Risk for Stroke With Nonvalvular Atrial Fibrillation. J Am Coll Cardiol 2013; 61:1790-8. Aline August DR, Mia [place], Price M, Whisenant B, Sievert H, Doshi S, Huber K, Reddy V. Prospective randomized evaluation of the Watchman left atrial appendage Device in patients with atrial fibrillation versus long-term warfarin therapy; the PREVAIL trial. Journal of the [employer] of Cardiology, Vol. 4, No. 1, 2014, 1-11. - Kar S, Doshi SK, Sadhu A, Horton R, Osorio J et al. Primary outcome evaluation of a next-generation left atrial appendage closure device: results from the PINNACLE FLX trial. Circulation 2021;143(18)1754-1762.      After today's visit with the patient which was dedicated solely for shared decision making visit regarding LAA closure device, the patient decided to proceed with the LAA appendage closure procedure scheduled to be done in the near future at [facility]. Prior to the procedure, I would like to obtain an updated TTE.   HAS-BLED score 2 Hypertension Yes  Abnormal renal and liver function (Dialysis, transplant, Cr >2.26 mg/dL /Cirrhosis or Bilirubin >2x Normal or AST/ALT/AP >3x Normal) No  Stroke No  Bleeding No  Labile INR (Unstable/high INR) No  Elderly (>65) Yes  Drugs or alcohol (? 8 drinks/week, anti-plt or NSAID) No    CHA2DS2-VASc Score = 3  The patient's score is based upon: CHF History: 0 HTN History: 1 Diabetes History: 0 Stroke History: 0 Vascular Disease History: 0 Age Score: 2 Gender Score: 0   #Hypertension Above goal today.  Increase lisinopril to  40 mg by mouth once daily.  Recommend checking blood pressures 1-2 times per week at home and recording the values.  Recommend bringing these recordings to the primary care physician.    Presents for LAAO today. Procedure reviewed.     Signed, Steffanie Dunn, MD, [facility], [facility] 06/15/2022 Electrophysiology [facility]

## 2022-06-15 NOTE — Transfer of Care (Signed)
Immediate Anesthesia Transfer of Care Note  Patient: Kent James  Procedure(s) Performed: LEFT ATRIAL APPENDAGE OCCLUSION TRANSESOPHAGEAL ECHOCARDIOGRAM  Patient Location: Cath Lab  Anesthesia Type:General  Level of Consciousness: drowsy and patient cooperative  Airway & Oxygen Therapy: Patient Spontanous Breathing  Post-op Assessment: Report given to RN  Post vital signs: Reviewed and stable  Last Vitals:  Vitals Value Taken Time  BP 172/84 06/15/22 1010  Temp 36.3 C 06/15/22 0958  Pulse 66 06/15/22 1011  Resp 13 06/15/22 1011  SpO2 100 % 06/15/22 1011  Vitals shown include unvalidated device data.  Last Pain:  Vitals:   06/15/22 0958  TempSrc: Temporal  PainSc: 0-No pain         Complications: No notable events documented.

## 2022-06-15 NOTE — Anesthesia Preprocedure Evaluation (Signed)
Anesthesia Evaluation  Patient identified by MRN, date of birth, ID band Patient awake    Reviewed: Allergy & Precautions, H&P , NPO status , Patient's Chart, lab work & pertinent test results  Airway Mallampati: II   Neck ROM: full    Dental   Pulmonary asthma , former smoker   breath sounds clear to auscultation       Cardiovascular hypertension, + dysrhythmias Atrial Fibrillation  Rhythm:irregular Rate:Normal     Neuro/Psych    GI/Hepatic ,GERD  ,,  Endo/Other    Renal/GU      Musculoskeletal   Abdominal   Peds  Hematology   Anesthesia Other Findings   Reproductive/Obstetrics                             Anesthesia Physical Anesthesia Plan  ASA: 3  Anesthesia Plan: General   Post-op Pain Management:    Induction: Intravenous  PONV Risk Score and Plan: 2 and Ondansetron, Dexamethasone and Treatment may vary due to age or medical condition  Airway Management Planned: Oral ETT  Additional Equipment: Arterial line and TEE  Intra-op Plan:   Post-operative Plan: Extubation in OR  Informed Consent: I have reviewed the patients History and Physical, chart, labs and discussed the procedure including the risks, benefits and alternatives for the proposed anesthesia with the patient or authorized representative who has indicated his/her understanding and acceptance.     Dental advisory given  Plan Discussed with: CRNA, Anesthesiologist and Surgeon  Anesthesia Plan Comments:        Anesthesia Quick Evaluation

## 2022-06-15 NOTE — Progress Notes (Signed)
Went over discharge paper work with patient and wife. All questions answered. PIV and telemetry removed. All belongings at bedside.

## 2022-06-15 NOTE — Progress Notes (Signed)
  [facility]   MULTIDISCIPLINARY HEART TEAM  Patient doing well s/p LAAO. He is hemodynamically stable. Groin site remains stable. Plan for early ambulation after bedrest completed and hopeful discharge later today.   [name] NP-C Structural Heart Team  Pager: [phone] Phone: [phone]

## 2022-06-15 NOTE — Discharge Summary (Signed)
[facility]    Patient ID: Kent James,  MRN: [id number], DOB/AGE: 07/08/1945 77 y.o.  Admit date: 06/15/2022 Discharge date: 06/15/2022  Primary Care Physician: Lorre Munroe, NP  Primary Cardiologist: Christell Constant, MD  Electrophysiologist: Lanier Prude, MD  Primary Discharge Diagnosis:  Persistent Atrial Fibrillation Poor candidacy for long term anticoagulation due to refusal of long-term oral anticoagulation due to increased bleeding risk, cost, and moderate compliance.   Secondary Discharge Diagnosis:  HTN HLD CAD aortic atherosclerosis morbid obesity  Procedures This Admission:  Transeptal Puncture Intra-procedural TEE which showed no LAA thrombus Left atrial appendage occlusive device placement on 06/15/22 by Dr. Lalla Brothers.   This study demonstrated:  CONCLUSIONS:  1.Successful implantation of a WATCHMAN left atrial appendage occlusive device    2. TEE demonstrating no LAA thrombus 3. No early apparent complications.    Post Implant Anticoagulation Strategy: Continue Eliquis 5mg  PO BID for 45 days after implant. After 45 days, stop Eliquis and start Plavix 75mg  PO daily to complete 6 months of post implant medical therapy. Plan for CT scan 60 days after implant to assess Watchman positioning/seal.  Brief HPI: Kent James is a 77 y.o. male with a history of HTN, HLD, CAD, aortic atherosclerosis, morbid obesity, and persistent AF on a/c with compliance and cost concerns who was referred to Dr. Lalla Brothers for LAAO closure consideration.   He was initially referred to cardiology for the evaluation of atrial fibrillation in 2018 after presenting to the ED with symptoms of SOB and palpitations. He underwent DCCV x2 and was started on sotalol and anticoagulation and was eventually seen by our EP team. He had an AF ablation 12/11/2018 and was eventually taken off his AAD therapies. He wished to be evaluated for LAAO closure with Watchman to  avoid long term anticoagulation due to increased bleeding risks, moderate AC compliance, and cost associated with long term therapy.   Pre procedure CT showed anatomy suitable for implant which was scheduled for 06/15/22.   Hospital Course:  The patient was admitted and underwent left atrial appendage occlusive device placement with 24mm  Watchman FLX Pro device. He was monitored in the post procedure setting and has done very well with no concerns. Given this, he is being considered for same day discharge later today. Groin site has been stable without evidence of hematoma or bleeding. Wound care and restrictions were reviewed with the patient. The patient has been scheduled for post procedure follow up with [name], NP in approximately 1 month. A repeat CT will be performed in approximately 60 days to ensure proper seal of the device. He has dentures and will not require dental SBE.   Patient noted to be taking Prilosec. This will need to be transitioned to Protonix while taking Plavix.   Physical Exam: Vitals:   06/15/22 1115 06/15/22 1127 06/15/22 1130 06/15/22 1200  BP: (!) 151/81  (!) 150/92 (!) 157/95  Pulse: (!) 59 60 61 67  Resp: 16 18 15 20   Temp:  97.9 F (36.6 C)    TempSrc:  Temporal    SpO2: 94% 97% 96% 97%  Weight:      Height:       General: Well developed, well nourished, NAD Lungs:Clear to ausculation bilaterally.  Cardiovascular: No murmurs Extremities: No edema. Groin site with no hematoma.  Neuro: Alert and oriented. No focal deficits. No facial asymmetry. MAE spontaneously. Psych: Responds to questions appropriately with normal affect.    Labs:  Lab Results  Component Value Date   WBC 7.8 05/20/2022   HGB 17.1 05/20/2022   HCT 49.9 05/20/2022   MCV 90 05/20/2022   PLT 196 05/20/2022   No results for input(s): "NA", "K", "CL", "CO2", "BUN", "CREATININE", "CALCIUM", "PROT", "BILITOT", "ALKPHOS", "ALT", "AST", "GLUCOSE" in the last 168 hours.  Invalid  input(s): "LABALBU"   Discharge Medications:  Allergies as of 06/15/2022   No Known Allergies      Medication List     TAKE these medications    apixaban 5 MG Tabs tablet Commonly known as: Eliquis Take 1 tablet (5 mg total) by mouth 2 (two) times daily. Notes to patient: Restart Eliquis tonight, 06/15/22   lisinopril 40 MG tablet Commonly known as: ZESTRIL Take 1 tablet (40 mg total) by mouth daily.   omeprazole 20 MG capsule Commonly known as: PRILOSEC Take 20 mg by mouth in the morning.   rosuvastatin 5 MG tablet Commonly known as: CRESTOR Take 1 tablet (5 mg total) by mouth daily. What changed: when to take this        Disposition:  Home  Discharge Instructions     Call MD for:  difficulty breathing, headache or visual disturbances   Complete by: As directed    Call MD for:  extreme fatigue   Complete by: As directed    Call MD for:  hives   Complete by: As directed    Call MD for:  persistant dizziness or light-headedness   Complete by: As directed    Call MD for:  persistant nausea and vomiting   Complete by: As directed    Call MD for:  redness, tenderness, or signs of infection (pain, swelling, redness, odor or green/yellow discharge around incision site)   Complete by: As directed    Call MD for:  severe uncontrolled pain   Complete by: As directed    Call MD for:  temperature >100.4   Complete by: As directed    Diet - low sodium heart healthy   Complete by: As directed    Discharge instructions   Complete by: As directed    [facility] Procedure, Care After  Procedure MD: Dr. Isidoro Donning Clinical Coordinator: Karsten Fells, RN  This sheet gives you information about how to care for yourself after your procedure. Your health care provider may also give you more specific instructions. If you have problems or questions, contact your health care provider.  What can I expect after the procedure? After the procedure, it is common to have: Bruising  around your puncture site. Tenderness around your puncture site. Tiredness (fatigue).  Medication instructions It is very important to continue to take your blood thinner as directed by your doctor after the Watchman procedure. Call your procedure doctor's office with question or concerns. If you are on Coumadin (warfarin), you will have your INR checked the week after your procedure, with a goal INR of 2.0 - 3.0. Please follow your medication instructions on your discharge summary. Only take the medications listed on your discharge paperwork.  Follow up You will be seen in 1 month after your procedure You will have a repeat CT scan approximately 8 weeks after your procedure mark to check your device You will follow up the MD/APP who performed your procedure 6 months after your procedure The Watchman Clinical Coordinator will check in with you from time to time, including 1 and 2 years after your procedure.    Follow these instructions at home: Puncture site care  Follow instructions from your health care provider about how to take care of your puncture site. Make sure you: If present, leave stitches (sutures), skin glue, or adhesive strips in place.  If a large square bandage is present, this may be removed 24 hours after surgery.  Check your puncture site every day for signs of infection. Check for: Redness, swelling, or pain. Fluid or blood. If your puncture site starts to bleed, lie down on your back, apply firm pressure to the area, and contact your health care provider. Warmth. Pus or a bad smell. Driving Do not drive yourself home if you received sedation Do not drive for at least 4 days after your procedure or however long your health care provider recommends. (Do not resume driving if you have previously been instructed not to drive for other health reasons.) Do not spend greater than 1 hour at a time in a car for the first 3 days. Stop and take a break with a 5 minute walk at  least every hour.  Do not drive or use heavy machinery while taking prescription pain medicine.  Activity Avoid activities that take a lot of effort, including exercise, for at least 7 days after your procedure. For the first 3 days, avoid sitting for longer than one hour at a time.  Avoid alcoholic beverages, signing paperwork, or participating in legal proceedings for 24 hours after receiving sedation Do not lift anything that is heavier than 10 lb (4.5 kg) for one week.  No sexual activity for 1 week.  Return to your normal activities as told by your health care provider. Ask your health care provider what activities are safe for you. General instructions Take over-the-counter and prescription medicines only as told by your health care provider. Do not use any products that contain nicotine or tobacco, such as cigarettes and e-cigarettes. If you need help quitting, ask your health care provider. You may shower after 24 hours, but Do not take baths, swim, or use a hot tub for 1 week.  Do not drink alcohol for 24 hours after your procedure. Keep all follow-up visits as told by your health care provider. This is important. Dental Work: You will require antibiotics prior to any dental work, including cleanings, for 6 months after your Watchman implantation to help protect you from infection. After 6 months, antibiotics are no longer required. Contact a health care provider if: You have redness, mild swelling, or pain around your puncture site. You have soreness in your throat or at your puncture site that does not improve after several days You have fluid or blood coming from your puncture site that stops after applying firm pressure to the area. Your puncture site feels warm to the touch. You have pus or a bad smell coming from your puncture site. You have a fever. You have chest pain or discomfort that spreads to your neck, jaw, or arm. You are sweating a lot. You feel nauseous. You have  a fast or irregular heartbeat. You have shortness of breath. You are dizzy or light-headed and feel the need to lie down. You have pain or numbness in the arm or leg closest to your puncture site. Get help right away if: Your puncture site suddenly swells. Your puncture site is bleeding and the bleeding does not stop after applying firm pressure to the area. These symptoms may represent a serious problem that is an emergency. Do not wait to see if the symptoms will go away. Get medical help right  away. Call your local emergency services (911 in the [place] Do not drive yourself to the hospital. Summary After the procedure, it is normal to have bruising and tenderness at the puncture site in your groin, neck, or forearm. Check your puncture site every day for signs of infection. Get help right away if your puncture site is bleeding and the bleeding does not stop after applying firm pressure to the area. This is a medical emergency.  This information is not intended to replace advice given to you by your health care provider. Make sure you discuss any questions you have with your health care provider.   Increase activity slowly   Complete by: As directed        Follow-up Information     Filbert Schilder, NP Follow up on 08/03/2022.   Specialty: Cardiology Why: Your appointment will be at 9:30am. Please arrive at 9:15am Contact information: [address] [place] [address] [phone]                 Duration of Discharge Encounter: Greater than 30 minutes including physician time.  Signed, [name], NP  06/15/2022 2:51 PM

## 2022-06-16 ENCOUNTER — Encounter (HOSPITAL_COMMUNITY): Payer: Self-pay | Admitting: Cardiology

## 2022-06-16 ENCOUNTER — Telehealth: Payer: Self-pay

## 2022-06-16 DIAGNOSIS — I480 Paroxysmal atrial fibrillation: Secondary | ICD-10-CM

## 2022-06-16 NOTE — Telephone Encounter (Signed)
  [facility]   Watchman Team  Contacted the patient regarding discharge from [facility] on 06/15/2022  The patient understands to follow up with Carlean Jews, PA on 08/12/2022 in preparation for imaging on 08/19/2022.  The patient understands discharge instructions? Yes  The patient understands medications and regimen? Yes   The patient reports groin site looks healthy with no signs or symptoms of bleeding or infection.  The patient understands to call with any questions or concerns prior to scheduled visit.

## 2022-06-16 NOTE — Anesthesia Postprocedure Evaluation (Signed)
Anesthesia Post Note  Patient: Kent James  Procedure(s) Performed: LEFT ATRIAL APPENDAGE OCCLUSION TRANSESOPHAGEAL ECHOCARDIOGRAM     Patient location during evaluation: Cath Lab Anesthesia Type: General Level of consciousness: awake and alert Pain management: pain level controlled Vital Signs Assessment: post-procedure vital signs reviewed and stable Respiratory status: spontaneous breathing, nonlabored ventilation, respiratory function stable and patient connected to nasal cannula oxygen Cardiovascular status: blood pressure returned to baseline and stable Postop Assessment: no apparent nausea or vomiting Anesthetic complications: no   No notable events documented.  Last Vitals:  Vitals:   06/15/22 1130 06/15/22 1200  BP: (!) 150/92 (!) 157/95  Pulse: 61 67  Resp: 15 20  Temp:    SpO2: 96% 97%    Last Pain:  Vitals:   06/15/22 1127  TempSrc: Temporal  PainSc:                  Herb Beltre S

## 2022-06-18 LAB — BPAM RBC
Blood Product Expiration Date: 202407022359
Blood Product Expiration Date: 202407032359
ISSUE DATE / TIME: 202406121335
Unit Type and Rh: 7300
Unit Type and Rh: 7300
Unit Type and Rh: 7300

## 2022-06-18 LAB — TYPE AND SCREEN
Antibody Screen: POSITIVE
Unit division: 0
Unit division: 0
Unit division: 0

## 2022-06-28 LAB — POCT ACTIVATED CLOTTING TIME: Activated Clotting Time: 421 seconds

## 2022-07-05 ENCOUNTER — Telehealth: Payer: Self-pay | Admitting: Cardiology

## 2022-07-05 NOTE — Telephone Encounter (Signed)
For four hours this morning he felt pains in chest and heartburn.  These are the same as he has had in the past.   He remains w the irregular heartbeat noted by wrist check.  Remained 70 bpm.    Hasn't missed any Eliquis.    Adv if he develops worsening symptoms again or faster heart rate to let us know and we can arrange follow up in afib clinic.  He is aware I will let Dr. Lalla Brothers and Dr. Izora Ribas know.

## 2022-07-05 NOTE — Telephone Encounter (Signed)
Patient c/o Palpitations:  High priority if patient c/o lightheadedness, shortness of breath, or chest pain  How long have you had palpitations/irregular HR/ Afib? Patient states he has been in A-fib since this 8am this morning. Are you having the symptoms now? Yes   Are you currently experiencing lightheadedness, SOB or CP? No.  States he had heavy indigestion pain this morning.   Do you have a history of afib (atrial fibrillation) or irregular heart rhythm? Has history of A-fib.   Have you checked your BP or HR? (document readings if available): BP 140/100,  HR 70 states machine stated he had an irregular heartbeat.   Are you experiencing any other symptoms? no

## 2022-07-06 NOTE — Telephone Encounter (Signed)
Called pt advised of MD response: He may be best served for A fib clinic eval.  If he needs TEE/DCCV we may be able to both evaluate his FLX device and get him back into rhythm  Sent a message to Afib clinic scheduler.  Advised pt can also call Afib clinic to set up OV.   Reports is currently in Afib has not checked HR today.

## 2022-07-06 NOTE — Telephone Encounter (Signed)
AF clinic appointment scheduled for 07/12/22 @ 10:30 am.

## 2022-07-12 ENCOUNTER — Ambulatory Visit (HOSPITAL_COMMUNITY)
Admission: RE | Admit: 2022-07-12 | Discharge: 2022-07-12 | Disposition: A | Discharge status: Home / Self Care | Payer: Medicare HMO | Source: Ambulatory Visit | Attending: Internal Medicine | Admitting: Internal Medicine

## 2022-07-12 VITALS — BP 184/96 | HR 70 | Ht 70.0 in | Wt 252.6 lb

## 2022-07-12 DIAGNOSIS — I480 Paroxysmal atrial fibrillation: Secondary | ICD-10-CM | POA: Insufficient documentation

## 2022-07-12 DIAGNOSIS — R9431 Abnormal electrocardiogram [ECG] [EKG]: Secondary | ICD-10-CM | POA: Diagnosis not present

## 2022-07-12 DIAGNOSIS — I10 Essential (primary) hypertension: Secondary | ICD-10-CM | POA: Diagnosis not present

## 2022-07-12 DIAGNOSIS — Z7901 Long term (current) use of anticoagulants: Secondary | ICD-10-CM | POA: Insufficient documentation

## 2022-07-12 DIAGNOSIS — I351 Nonrheumatic aortic (valve) insufficiency: Secondary | ICD-10-CM | POA: Diagnosis not present

## 2022-07-12 DIAGNOSIS — Z6836 Body mass index (BMI) 36.0-36.9, adult: Secondary | ICD-10-CM | POA: Insufficient documentation

## 2022-07-12 DIAGNOSIS — E669 Obesity, unspecified: Secondary | ICD-10-CM | POA: Diagnosis not present

## 2022-07-12 DIAGNOSIS — I444 Left anterior fascicular block: Secondary | ICD-10-CM | POA: Insufficient documentation

## 2022-07-12 DIAGNOSIS — Z8249 Family history of ischemic heart disease and other diseases of the circulatory system: Secondary | ICD-10-CM | POA: Diagnosis not present

## 2022-07-12 NOTE — Progress Notes (Signed)
Primary Care Physician: Lorre Munroe, NP Primary Cardiologist: Dr Bjorn Pippin Primary Electrophysiologist: Dr Johney Frame Referring Physician: Dr Felisa Bonier Kent James is a 77 y.o. male with a history of paroxysmal atrial fibrillation and HTN who presents for follow up in the [facility].  The patient was initially diagnosed with atrial fibrillation in 2018 after presenting with symptoms of shortness of breath and an irregular pulse. He underwent two DCCV in 2018 and was started on sotalol. He had done well until 10/23/18 when he again had afib symptoms with heart rates in the 170s. He presented to the ER but converted to SR before being seen. He was seen by Dr Bjorn Pippin and started on anticoagulation. He is on Eliquis for a CHADS2VASC score of 2. Patient does admit to snoring and witnessed apnea and a sleep study has been ordered. Patient reports that 11/05/18 he woke up in the middle of the night and felt he was back out of rhythm. He does have symptoms of fatigue with exertion and palpitations. Patient is s/p afib ablation on 12/11/18 with Dr Johney Frame. He did well post ablation and his sotalol was discontinued.   On follow up today, patient reports he has done well since his last visit. He denies any episodes of heart racing since his ablation. He denies any bleeding issues on anticoagulation.   On follow up 07/12/22, patient is currently in NSR. He has been in Afib since 07/05/22 per phone documentation. Patient noted he spontaneously converted to NSR on the morning of 7/6. No Afib since then. He feels an uneasiness when in Afib, like something feels off. S/p Watchman procedure on 06/15/22. No missed doses of Eliquis.   Today, he denies symptoms of palpitations, chest pain, shortness of breath, orthopnea, PND, lower extremity edema, dizziness, presyncope, syncope, bleeding, or neurologic sequela. The patient is tolerating medications without difficulties and is otherwise without  complaint today.    Atrial Fibrillation Risk Factors:  he does not have symptoms or diagnosis of sleep apnea. Neg sleep study. he does not have a history of rheumatic fever. he does not have a history of alcohol use. The patient does not have a history of early familial atrial fibrillation or other arrhythmias.  he has a BMI of Body mass index is 36.24 kg/m.Marland Kitchen Filed Weights   07/12/22 1028  Weight: 114.6 kg     Family History  Problem Relation Age of Onset   Heart attack Father    Breast cancer Sister    Arthritis Maternal Grandmother      Atrial Fibrillation Management history:  Previous antiarrhythmic drugs: sotalol Previous cardioversions: 2018 x2 Previous ablations: 12/11/18 CHADS2VASC score: 2 Anticoagulation history: Eliquis; S/p Watchman device 06/15/22   Past Medical History:  Diagnosis Date   Childhood asthma    Essential hypertension    GERD (gastroesophageal reflux disease)    History of chicken pox    History of colon polyps    Hyperlipidemia    Persistent atrial fibrillation (HCC)    Presence of Watchman left atrial appendage closure device 06/15/2022   24mm Watchman FLX Pro placed by Dr. Lalla Brothers   Snoring    Past Surgical History:  Procedure Laterality Date   ATRIAL FIBRILLATION ABLATION N/A 12/11/2018   Procedure: ATRIAL FIBRILLATION ABLATION;  Surgeon: Hillis Range, MD;  Location: [facility];  Service: Cardiovascular;  Laterality: N/A;   CARDIOVERSION     IMAGE GUIDED SINUS SURGERY N/A 10/29/2019   Procedure: IMAGE GUIDED SINUS  SURGERY;  Surgeon: Bud Face, MD;  Location: [facility] ORS;  Service: ENT;  Laterality: N/A;   LEFT ATRIAL APPENDAGE OCCLUSION N/A 06/15/2022   Procedure: LEFT ATRIAL APPENDAGE OCCLUSION;  Surgeon: Lanier Prude, MD;  Location: [facility] CV LAB;  Service: Cardiovascular;  Laterality: N/A;   MAXILLARY ANTROSTOMY Right 10/29/2019   Procedure: MAXILLARY ANTROSTOMY;  Surgeon: Bud Face, MD;  Location:  [facility] ORS;  Service: ENT;  Laterality: Right;   TEE WITHOUT CARDIOVERSION N/A 12/11/2018   Procedure: TRANSESOPHAGEAL ECHOCARDIOGRAM (TEE);  Surgeon: Parke Poisson, MD;  Location: [facility] ENDOSCOPY;  Service: Cardiology;  Laterality: N/A;   TEE WITHOUT CARDIOVERSION N/A 06/15/2022   Procedure: TRANSESOPHAGEAL ECHOCARDIOGRAM;  Surgeon: Lanier Prude, MD;  Location: [facility] INVASIVE CV LAB;  Service: Cardiovascular;  Laterality: N/A;    Current Outpatient Medications  Medication Sig Dispense Refill   apixaban (ELIQUIS) 5 MG TABS tablet Take 1 tablet (5 mg total) by mouth 2 (two) times daily. 180 tablet 1   lisinopril (ZESTRIL) 40 MG tablet Take 1 tablet (40 mg total) by mouth daily. 90 tablet 3   omeprazole (PRILOSEC) 20 MG capsule Take 20 mg by mouth in the morning.     rosuvastatin (CRESTOR) 5 MG tablet Take 1 tablet (5 mg total) by mouth daily. (Patient taking differently: Take 5 mg by mouth every evening.) 90 tablet 3   No current facility-administered medications for this encounter.    No Known Allergies  ROS- All systems are reviewed and negative except as per the HPI above.  Physical Exam: Vitals:   07/12/22 1028  BP: (!) 184/96  Pulse: 70  Weight: 114.6 kg  Height: 5\' 10"  (1.778 m)    GEN- The patient is well appearing, alert and oriented x 3 today.   Neck - no JVD or carotid bruit noted Lungs- Clear to ausculation bilaterally, normal work of breathing Heart- Regular rate and rhythm, no murmurs, rubs or gallops, PMI not laterally displaced Extremities- no clubbing, cyanosis, or edema Skin - no rash or ecchymosis noted   Wt Readings from Last 3 Encounters:  07/12/22 114.6 kg  06/15/22 112 kg  05/20/22 112 kg    EKG today demonstrates  Vent. rate 70 BPM PR interval 196 ms QRS duration 94 ms QT/QTcB 388/419 ms P-R-T axes 47 -45 64 Normal sinus rhythm Left anterior fascicular block Abnormal ECG When compared with ECG of 15-Jun-2022 10:11, PREVIOUS ECG IS  PRESENT  Echo 03/17/22: 1. Left ventricular ejection fraction, by estimation, is 60 to 65%. The  left ventricle has normal function. The left ventricle has no regional  wall motion abnormalities. Left ventricular diastolic parameters were  normal.   2. Right ventricular systolic function is normal. The right ventricular  size is normal.   3. The mitral valve is normal in structure. Trivial mitral valve  regurgitation. No evidence of mitral stenosis.   4. The aortic valve is tricuspid. Aortic valve regurgitation is mild. No  aortic stenosis is present. Aortic regurgitation PHT measures 639 msec.   5. Aortic dilatation noted. There is mild dilatation of the ascending  aorta, measuring 40 mm.   6. The inferior vena cava is normal in size with greater than 50%  respiratory variability, suggesting right atrial pressure of 3 mmHg.   Epic records are reviewed at length today  Assessment and Plan:  1. Paroxysmal atrial fibrillation S/p afib ablation 12/11/18 S/p Watchman device 06/15/22  Patient is in NSR.   He feels well overall and we will proceed  with conservative observation. Going forward, if he has increased burden can revisit with Dr. Lalla Brothers regarding repeat ablation and could consider Tikosyn as bridge. He cannot take flecainide due to coronary calcium score and conduction disease.   2. Obesity Body mass index is 36.24 kg/m. Lifestyle modification was discussed and encouraged including regular physical activity and weight reduction.  3. HTN Elevated greatly today - pt states it runs much lower at home 130-140s. Advised to monitor at home.   Follow up Afib clinic prn.   Lake Bells, PA-C Afib Clinic [facility] [address] [place], [place] [address] [phone] 07/12/2022 10:59 AM

## 2022-07-19 ENCOUNTER — Encounter: Payer: Self-pay | Admitting: Cardiology

## 2022-07-20 ENCOUNTER — Ambulatory Visit: Payer: Medicare HMO

## 2022-07-20 MED ORDER — PANTOPRAZOLE SODIUM 20 MG PO TBEC: 20.0000 mg | DELAYED_RELEASE_TABLET | Freq: Every day | ORAL | 3 refills | Status: DC

## 2022-07-20 MED ORDER — CLOPIDOGREL BISULFATE 75 MG PO TABS: 75.0000 mg | ORAL_TABLET | Freq: Every day | ORAL | 1 refills | Status: DC

## 2022-08-03 ENCOUNTER — Ambulatory Visit: Payer: Medicare HMO

## 2022-08-12 ENCOUNTER — Ambulatory Visit: Payer: Medicare HMO

## 2022-08-12 ENCOUNTER — Ambulatory Visit: Payer: Medicare HMO | Attending: Cardiovascular Disease | Admitting: Physician Assistant

## 2022-08-12 VITALS — BP 116/70 | HR 98 | Ht 70.0 in | Wt 250.0 lb

## 2022-08-12 DIAGNOSIS — K219 Gastro-esophageal reflux disease without esophagitis: Secondary | ICD-10-CM | POA: Diagnosis not present

## 2022-08-12 DIAGNOSIS — I480 Paroxysmal atrial fibrillation: Secondary | ICD-10-CM | POA: Diagnosis not present

## 2022-08-12 DIAGNOSIS — Z95818 Presence of other cardiac implants and grafts: Secondary | ICD-10-CM | POA: Diagnosis not present

## 2022-08-12 LAB — BASIC METABOLIC PANEL
BUN/Creatinine Ratio: 10 (ref 10–24)
BUN: 11 mg/dL (ref 8–27)
CO2: 22 mmol/L (ref 20–29)
Calcium: 9.6 mg/dL (ref 8.6–10.2)
Chloride: 102 mmol/L (ref 96–106)
Creatinine, Ser: 1.05 mg/dL (ref 0.76–1.27)
Glucose: 136 mg/dL — ABNORMAL HIGH (ref 70–99)
Potassium: 3.8 mmol/L (ref 3.5–5.2)
Sodium: 139 mmol/L (ref 134–144)
eGFR: 74 mL/min/{1.73_m2} (ref >59)

## 2022-08-12 NOTE — Patient Instructions (Signed)
Medication Instructions:  Your physician recommends that you continue on your current medications as directed. Please refer to the Current Medication list given to you today.  *If you need a refill on your cardiac medications before your next appointment, please call your pharmacy*   Lab Work: Bmp - today   If you have labs (blood work) drawn today and your tests are completely normal, you will receive your results only by: MyChart Message (if you have MyChart) OR A paper copy in the mail If you have any lab test that is abnormal or we need to change your treatment, we will call you to review the results.   Testing/Procedures: Your WATCHMAN CT is scheduled at [facility] on 8/16 at 11:30am.   Follow-Up: Follow up as scheduled   Other Instructions

## 2022-08-12 NOTE — Progress Notes (Signed)
[facility]   MULTIDISCIPLINARY HEART VALVE CLINIC                                     Cardiology Office Note:    Date:  08/12/2022   ID:  Kent James, DOB 1945-07-18, MRN [id number]  PCP:  Kent Munroe, NP  [facility] Cardiologist:  Kent Constant, MD  [facility] Electrophysiologist:  Kent Prude, MD   Referring MD: Kent Munroe, NP   Follow up s/p LAAO  History of Present Illness:    Kent James is a 77 y.o. male with a hx of HTN, HLD, CAD, GERD, morbid obesity, paroxysmal atrial fibrillation s/p LAAO (06/15/22) who presents to clinic for follow up.   He was initially referred to cardiology for the evaluation of atrial fibrillation in 2018 after presenting to the ED with symptoms of SOB and palpitations. He underwent DCCV x2 and was started on sotalol and anticoagulation and was eventually seen by our EP team. He had an AF ablation 12/11/2018 and was eventually taken off his AAD therapies. He wished to be evaluated for LAAO closure with Watchman to avoid long term anticoagulation due to increased bleeding risks, moderate AC compliance, and cost associated with long term therapy. He underwent left atrial appendage occlusive device placement with 24mm  Watchman FLX Pro device on 06/15/22. He was continued on Eliquis 5mg  PO BID for 45 days after implant and then transitioned to Plavix 75mg  PO daily to complete 6 months of post implant medical therapy. Plan for CT scan 60 days after implant to assess Watchman positioning/seal.   Today the patient presents to clinic for follow up. Having more episodes of aib since Watchman. Wants to discuss repeat ablation with Dr. Lalla Brothers. No CP but does have some mild intermittent SOB. No LE edema, orthopnea or PND. No dizziness or syncope. No blood in stool or urine. No palpitations.     Past Medical History:  Diagnosis Date   Childhood asthma    Essential hypertension    GERD (gastroesophageal reflux disease)     History of chicken pox    History of colon polyps    Hyperlipidemia    Persistent atrial fibrillation (HCC)    Presence of Watchman left atrial appendage closure device 06/15/2022   24mm Watchman FLX Pro placed by Dr. Lalla Brothers   Snoring     Past Surgical History:  Procedure Laterality Date   ATRIAL FIBRILLATION ABLATION N/A 12/11/2018   Procedure: ATRIAL FIBRILLATION ABLATION;  Surgeon: Kent Range, MD;  Location: [facility];  Service: Cardiovascular;  Laterality: N/A;   CARDIOVERSION     IMAGE GUIDED SINUS SURGERY N/A 10/29/2019   Procedure: IMAGE GUIDED SINUS SURGERY;  Surgeon: Kent Face, MD;  Location: [facility] ORS;  Service: ENT;  Laterality: N/A;   LEFT ATRIAL APPENDAGE OCCLUSION N/A 06/15/2022   Procedure: LEFT ATRIAL APPENDAGE OCCLUSION;  Surgeon: Kent Prude, MD;  Location: [facility];  Service: Cardiovascular;  Laterality: N/A;   MAXILLARY ANTROSTOMY Right 10/29/2019   Procedure: MAXILLARY ANTROSTOMY;  Surgeon: Kent Face, MD;  Location: [facility] ORS;  Service: ENT;  Laterality: Right;   TEE WITHOUT CARDIOVERSION N/A 12/11/2018   Procedure: TRANSESOPHAGEAL ECHOCARDIOGRAM (TEE);  Surgeon: Kent Poisson, MD;  Location[facility] ENDOSCOPY;  Service: Cardiology;  Laterality: N/A;   TEE WITHOUT CARDIOVERSION N/A 06/15/2022   Procedure: TRANSESOPHAGEAL ECHOCARDIOGRAM;  Surgeon: Lalla Brothers,  Kent Muskrat, MD;  Location: [facility];  Service: Cardiovascular;  Laterality: N/A;    Current Medications: Current Meds  Medication Sig   clopidogrel (PLAVIX) 75 MG tablet Take 1 tablet (75 mg total) by mouth daily.   lisinopril (ZESTRIL) 40 MG tablet Take 1 tablet (40 mg total) by mouth daily.   pantoprazole (PROTONIX) 20 MG tablet Take 1 tablet (20 mg total) by mouth daily.   rosuvastatin (CRESTOR) 5 MG tablet Take 1 tablet (5 mg total) by mouth daily. (Patient taking differently: Take 5 mg by mouth every evening.)     Allergies:   Patient has no known  allergies.   Social History   Socioeconomic History   Marital status: Married    Spouse name: Not on file   Number of children: Not on file   Years of education: Not on file   Highest education level: Not on file  Occupational History   Not on file  Tobacco Use   Smoking status: Former    Types: Cigarettes    Start date: 03/14/2017    Passive exposure: Never   Smokeless tobacco: Never  Vaping Use   Vaping status: Never Used  Substance and Sexual Activity   Alcohol use: Yes    Alcohol/week: 2.0 standard drinks of alcohol    Types: 2 Standard drinks or equivalent per week    Comment: couple daily   Drug use: No   Sexual activity: Not on file  Other Topics Concern   Not on file  Social History Narrative   Lives in [place] with spouse.   Retired Medical illustrator   Social Determinants of Corporate investment banker Strain: Low Risk  (09/21/2021)   Overall Financial Resource Strain (CARDIA)    Difficulty of Paying Living Expenses: Not very hard  Food Insecurity: No Food Insecurity (09/21/2021)   Hunger Vital Sign    Worried About Running Out of Food in the Last Year: Never true    Ran Out of Food in the Last Year: Never true  Transportation Needs: No Transportation Needs (09/21/2021)   PRAPARE - Administrator, Civil Service (Medical): No    Lack of Transportation (Non-Medical): No  Physical Activity: Not on file  Stress: No Stress Concern Present (09/21/2021)   [employer] of Occupational Health - Occupational Stress Questionnaire    Feeling of Stress : Not at all  Social Connections: Not on file     Family History: The patient's family history includes Arthritis in his maternal grandmother; Breast cancer in his sister; Heart attack in his father.  ROS:   Please see the history of present illness.    All other systems reviewed and are negative.  EKGs/Labs/Other Studies Reviewed:    Cardiac Studies & Procedures       ECHOCARDIOGRAM  ECHOCARDIOGRAM  COMPLETE 03/17/2022  Narrative ECHOCARDIOGRAM REPORT    Patient Name:   Kent James Date of Exam: 03/17/2022 Medical Rec #:  [id number]      Height:       71.0 in Accession #:    [id number]     Weight:       253.0 lb Date of Birth:  09-Dec-1945      BSA:          2.330 m Patient Age:    76 years       BP:           16+0/90 mmHg Patient Gender: M  HR:           67 bpm. Exam Location:  [address]  Procedure: 2D Echo, Cardiac Doppler and Color Doppler  Indications:    I48.0 Atrial Fibrillation  History:        Patient has prior history of Echocardiogram examinations, most recent 11/12/2018. Arrythmias:Atrial Fibrillation; Risk Factors:Hypertension, Dyslipidemia, Family History of Coronary Artery Disease and Former Smoker. Pre-Operative Eval for WATCHMAN implantation, status post A-FIb Ablation.  Sonographer:    Farrel Conners RDCS Referring Phys: Kent James  IMPRESSIONS   1. Left ventricular ejection fraction, by estimation, is 60 to 65%. The left ventricle has normal function. The left ventricle has no regional wall motion abnormalities. Left ventricular diastolic parameters were normal. 2. Right ventricular systolic function is normal. The right ventricular size is normal. 3. The mitral valve is normal in structure. Trivial mitral valve regurgitation. No evidence of mitral stenosis. 4. The aortic valve is tricuspid. Aortic valve regurgitation is mild. No aortic stenosis is present. Aortic regurgitation PHT measures 639 msec. 5. Aortic dilatation noted. There is mild dilatation of the ascending aorta, measuring 40 mm. 6. The inferior vena cava is normal in size with greater than 50% respiratory variability, suggesting right atrial pressure of 3 mmHg.  FINDINGS Left Ventricle: Left ventricular ejection fraction, by estimation, is 60 to 65%. The left ventricle has normal function. The left ventricle has no regional wall motion abnormalities. The left  ventricular internal cavity size was normal in size. There is no left ventricular hypertrophy. Left ventricular diastolic parameters were normal. Normal left ventricular filling pressure.  Right Ventricle: The right ventricular size is normal. No increase in right ventricular wall thickness. Right ventricular systolic function is normal.  Left Atrium: Left atrial size was normal in size.  Right Atrium: Right atrial size was normal in size.  Pericardium: There is no evidence of pericardial effusion.  Mitral Valve: The mitral valve is normal in structure. Trivial mitral valve regurgitation. No evidence of mitral valve stenosis.  Tricuspid Valve: The tricuspid valve is normal in structure. Tricuspid valve regurgitation is not demonstrated. No evidence of tricuspid stenosis.  Aortic Valve: The aortic valve is tricuspid. Aortic valve regurgitation is mild. Aortic regurgitation PHT measures 639 msec. No aortic stenosis is present.  Pulmonic Valve: The pulmonic valve was normal in structure. Pulmonic valve regurgitation is trivial. No evidence of pulmonic stenosis.  Aorta: Aortic dilatation noted. There is mild dilatation of the ascending aorta, measuring 40 mm.  Venous: The inferior vena cava is normal in size with greater than 50% respiratory variability, suggesting right atrial pressure of 3 mmHg.  IAS/Shunts: No atrial level shunt detected by color flow Doppler.   LEFT VENTRICLE PLAX 2D LVIDd:         4.60 cm   Diastology LVIDs:         2.60 cm   LV e' medial:    8.38 cm/s LV PW:         0.80 cm   LV E/e' medial:  9.8 LV IVS:        0.90 cm   LV e' lateral:   11.60 cm/s LVOT diam:     2.30 cm   LV E/e' lateral: 7.1 LV SV:         85 LV SV Index:   36 LVOT Area:     4.15 cm   RIGHT VENTRICLE RV Basal diam:  3.60 cm RV S prime:     10.00 cm/s TAPSE (M-mode): 1.1 cm  LEFT ATRIUM             Index        RIGHT ATRIUM           Index LA diam:        4.40 cm 1.89 cm/m   RA  Pressure: 3.00 mmHg LA Vol (A2C):   67.3 ml 28.88 ml/m  RA Area:     11.10 cm LA Vol (A4C):   60.4 ml 25.92 ml/m  RA Volume:   21.30 ml  9.14 ml/m LA Biplane Vol: 63.4 ml 27.21 ml/m AORTIC VALVE LVOT Vmax:   90.65 cm/s LVOT Vmean:  60.550 cm/s LVOT VTI:    0.204 m AI PHT:      639 msec  AORTA Ao Root diam: 3.50 cm Ao Asc diam:  4.00 cm  MITRAL VALVE               TRICUSPID VALVE MV Area (PHT)  cm         Estimated RAP:  3.00 mmHg MV Decel Time: 213 msec MV E velocity: 82.30 cm/s  SHUNTS MV A velocity: 73.35 cm/s  Systemic VTI:  0.20 m MV E/A ratio:  1.12        Systemic Diam: 2.30 cm  Chilton Si MD Electronically signed by Chilton Si MD Signature Date/Time: 03/17/2022/3:44:40 PM    Final   TEE  ECHO TEE 06/15/2022  Narrative TRANSESOPHOGEAL ECHO REPORT    Patient Name:   Kent James Date of Exam: 06/15/2022 Medical Rec #:  [id number]      Height:       70.0 in Accession #:    [id number]     Weight:       247.0 lb Date of Birth:  Jan 26, 1945      BSA:          2.283 m Patient Age:    76 years       BP:           154/88 mmHg Patient Gender: M              HR:           70 bpm. Exam Location:  Inpatient  Procedure: Transesophageal Echo, 3D Echo, Color Doppler and Cardiac Doppler  Indications:     I48.2 Chronic atrial fibrillation  History:         Patient has prior history of Echocardiogram examinations, most recent 03/17/2022. Arrythmias:Atrial Fibrillation; Risk Factors:Hypertension and Dyslipidemia.  Sonographer:     Irving Burton Senior RDCS Referring Phys:  [id number] Kent James Diagnosing Phys: Charlton Haws MD   Sonographer Comments: Watchman Procedure   PROCEDURE: After discussion of the risks and benefits of a TEE, an informed consent was obtained from the patient. The transesophogeal probe was passed without difficulty through the esophogus of the patient. Sedation performed by different physician. The patient was monitored while under  deep sedation. The patient's vital signs; including heart rate, blood pressure, and oxygen saturation; remained stable throughout the procedure. The patient developed no complications during the procedure.  IMPRESSIONS   1. Well placed 24 mm Watchman FLX device Negative tugg test No leak by color flow Average compression 12%. 2. Left ventricular ejection fraction, by estimation, is 60 to 65%. The left ventricle has normal function. The left ventricle has no regional wall motion abnormalities. 3. Right ventricular systolic function is normal. The right ventricular size is normal. 4. No left atrial/left atrial appendage thrombus was detected. 5. The mitral  valve is abnormal. Trivial mitral valve regurgitation. No evidence of mitral stenosis. 6. The aortic valve is tricuspid. Aortic valve regurgitation is mild. No aortic stenosis is present. 7. The inferior vena cava is normal in size with greater than 50% respiratory variability, suggesting right atrial pressure of 3 mmHg. 8. Left to right shunt only post trans septal puncture.  Conclusion(s)/Recommendation(s): Normal biventricular function without evidence of hemodynamically significant valvular heart disease.  FINDINGS Left Ventricle: Left ventricular ejection fraction, by estimation, is 60 to 65%. The left ventricle has normal function. The left ventricle has no regional wall motion abnormalities. The left ventricular internal cavity size was normal in size. There is no left ventricular hypertrophy.  Right Ventricle: The right ventricular size is normal. No increase in right ventricular wall thickness. Right ventricular systolic function is normal.  Left Atrium: Left atrial size was normal in size. No left atrial/left atrial appendage thrombus was detected.  Right Atrium: Right atrial size was normal in size.  Pericardium: There is no evidence of pericardial effusion.  Mitral Valve: The mitral valve is abnormal. There is mild thickening  of the mitral valve leaflet(s). Trivial mitral valve regurgitation. No evidence of mitral valve stenosis.  Tricuspid Valve: The tricuspid valve is normal in structure. Tricuspid valve regurgitation is mild . No evidence of tricuspid stenosis.  Aortic Valve: The aortic valve is tricuspid. Aortic valve regurgitation is mild. No aortic stenosis is present. Aortic valve mean gradient measures 2.0 mmHg. Aortic valve peak gradient measures 5.1 mmHg.  Pulmonic Valve: The pulmonic valve was normal in structure. Pulmonic valve regurgitation is not visualized. No evidence of pulmonic stenosis.  Aorta: The aortic root is normal in size and structure.  Venous: The inferior vena cava is normal in size with greater than 50% respiratory variability, suggesting right atrial pressure of 3 mmHg.  IAS/Shunts: Left to right shunt only post trans septal puncture.  Additional Comments: Well placed 24 mm Watchman FLX device Negative tugg test No leak by color flow Average compression 12%. Spectral Doppler performed.  AORTIC VALVE AV Vmax:      113.00 cm/s AV Vmean:     70.700 cm/s AV VTI:       0.211 m AV Peak Grad: 5.1 mmHg AV Mean Grad: 2.0 mmHg  Charlton Haws MD Electronically signed by Charlton Haws MD Signature Date/Time: 06/15/2022/11:36:40 AM    Final            EKG:  EKG is NOT ordered today.   Recent Labs: 01/10/2022: ALT 14 05/20/2022: BUN 15; Creatinine, Ser 0.94; Hemoglobin 17.1; Platelets 196; Potassium 4.5; Sodium 147  Recent Lipid Panel    Component Value Date/Time   CHOL 188 01/10/2022 1034   TRIG 117 01/10/2022 1034   HDL 44 01/10/2022 1034   CHOLHDL 4.3 01/10/2022 1034   VLDL 14.4 03/19/2019 1116   LDLCALC 122 (H) 01/10/2022 1034     Risk Assessment/Calculations:    CHA2DS2-VASc Score = 3   This indicates a 3.2% annual risk of stroke. The patient's score is based upon: CHF History: 0 HTN History: 1 Diabetes History: 0 Stroke History: 0 Vascular Disease History: 0 Age  Score: 2 Gender Score: 0           Physical Exam:    VS:  BP 116/70   Pulse 98   Ht 5\' 10"  (1.778 m)   Wt 250 lb (113.4 kg)   SpO2 97%   BMI 35.87 kg/m     Wt Readings from Last 3 Encounters:  08/12/22 250 lb (113.4 kg)  07/12/22 252 lb 9.6 oz (114.6 kg)  06/15/22 247 lb (112 kg)     GEN:  Well nourished, well developed in no acute distress HEENT: Normal NECK: No JVD LYMPHATICS: No lymphadenopathy CARDIAC: RRR, no murmurs, rubs, gallops RESPIRATORY:  Clear to auscultation without rales, wheezing or rhonchi  ABDOMEN: Soft, non-tender, non-distended MUSCULOSKELETAL:  No edema; No deformity  SKIN: Warm and dry NEUROLOGIC:  Alert and oriented x 3 PSYCHIATRIC:  Normal affect   ASSESSMENT:    1. Presence of Watchman left atrial appendage closure device   2. PAF (paroxysmal atrial fibrillation) (HCC)   3. Gastroesophageal reflux disease without esophagitis    PLAN:    In order of problems listed above:  Paroxysmal atrial fibrillation s/p LAAO: Eliquis already transitioned to Plavix. Nexium changed to Protonix while on Plavix. Repeat cardiac CT set up for 8/16. BMET today. Instruction letter given. Made an apt with Dr. Lalla Brothers in December (first available) to discuss repeat ablation.   CAD: would anticipated transitioning to a baby aspirin when he completes his 6 months of plavix therapy. Continue statin.   Medication Adjustments/Labs and Tests Ordered: Current medicines are reviewed at length with the patient today.  Concerns regarding medicines are outlined above.  Orders Placed This Encounter  Procedures   Basic metabolic panel   No orders of the defined types were placed in this encounter.   Patient Instructions  Medication Instructions:  Your physician recommends that you continue on your current medications as directed. Please refer to the Current Medication list given to you today.  *If you need a refill on your cardiac medications before your next  appointment, please call your pharmacy*   Lab Work: Bmp - today   If you have labs (blood work) drawn today and your tests are completely normal, you will receive your results only by: MyChart Message (if you have MyChart) OR A paper copy in the mail If you have any lab test that is abnormal or we need to change your treatment, we will call you to review the results.   Testing/Procedures: Your WATCHMAN CT is scheduled at [facility] on 8/16 at 11:30am.   Follow-Up: Follow up as scheduled   Other Instructions      Signed, Cline Crock, PA-C  08/12/2022 11:35 AM    [facility]

## 2022-08-18 ENCOUNTER — Ambulatory Visit (INDEPENDENT_AMBULATORY_CARE_PROVIDER_SITE_OTHER): Payer: Medicare HMO

## 2022-08-18 DIAGNOSIS — Z0000 Encounter for general adult medical examination without abnormal findings: Secondary | ICD-10-CM | POA: Diagnosis not present

## 2022-08-18 NOTE — Progress Notes (Signed)
Subjective:   Kent James is a 77 y.o. male who presents for Medicare Annual/Subsequent preventive examination.  Visit Complete: Virtual  I connected with  Kent James on 08/18/22 by a audio enabled telemedicine application and verified that I am speaking with the correct person using two identifiers.  Patient Location: Home  Provider Location: Office/Clinic  I discussed the limitations of evaluation and management by telemedicine. The patient expressed understanding and agreed to proceed.  Patient Medicare AWV questionnaire was completed by the patient on 08/11/2022; I have confirmed that all information answered by patient is correct and no changes since this date.  Vital Signs: Unable to obtain new vitals due to this being a telehealth visit.  Review of Systems     Cardiac Risk Factors include: advanced age (>54men, >34 women);dyslipidemia;hypertension;male gender     Objective:    Today's Vitals   There is no height or weight on file to calculate BMI.     08/18/2022   12:08 PM 06/15/2022    7:15 AM 10/23/2019   11:25 AM 12/20/2018    8:15 PM 12/11/2018    7:44 AM  Advanced Directives  Does Patient Have a Medical Advance Directive? No No No  No  Would patient like information on creating a medical advance directive?  No - Patient declined No - Patient declined Yes (MAU/Ambulatory/Procedural Areas - Information given) No - Patient declined    Current Medications (verified) Outpatient Encounter Medications as of 08/18/2022  Medication Sig   clopidogrel (PLAVIX) 75 MG tablet Take 1 tablet (75 mg total) by mouth daily.   lisinopril (ZESTRIL) 40 MG tablet Take 1 tablet (40 mg total) by mouth daily.   pantoprazole (PROTONIX) 20 MG tablet Take 1 tablet (20 mg total) by mouth daily.   rosuvastatin (CRESTOR) 5 MG tablet Take 1 tablet (5 mg total) by mouth daily. (Patient taking differently: Take 5 mg by mouth every evening.)   No facility-administered encounter  medications on file as of 08/18/2022.    Allergies (verified) Patient has no known allergies.   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 Lalla Brothers   Snoring    Past Surgical History:  Procedure Laterality Date   ATRIAL FIBRILLATION ABLATION N/A 12/11/2018   Procedure: ATRIAL FIBRILLATION ABLATION;  Surgeon: Hillis Range, MD;  Location: [facility];  Service: Cardiovascular;  Laterality: N/A;   CARDIOVERSION     IMAGE GUIDED SINUS SURGERY N/A 10/29/2019   Procedure: IMAGE GUIDED SINUS SURGERY;  Surgeon: Bud Face, MD;  Location: [facility] ORS;  Service: ENT;  Laterality: N/A;   LEFT ATRIAL APPENDAGE OCCLUSION N/A 06/15/2022   Procedure: LEFT ATRIAL APPENDAGE OCCLUSION;  Surgeon: Lanier Prude, MD;  Location: [facility];  Service: Cardiovascular;  Laterality: N/A;   MAXILLARY ANTROSTOMY Right 10/29/2019   Procedure: MAXILLARY ANTROSTOMY;  Surgeon: Bud Face, MD;  Location: [facility] ORS;  Service: ENT;  Laterality: Right;   TEE WITHOUT CARDIOVERSION N/A 12/11/2018   Procedure: TRANSESOPHAGEAL ECHOCARDIOGRAM (TEE);  Surgeon: Parke Poisson, MD;  Location: [facility] ENDOS[facility];  Service: Cardiology;  Laterality: N/A;   TEE WITHOUT CARDIOVERSION N/A 06/15/2022   Procedure: TRANSESOPHAGEAL  ECHOCARDIOGRAM;  Surgeon: Lanier Prude, MD;  Location: [facility] INVASIVE CV LAB;  Service: Cardiovascular;  Laterality: N/A;   Family History  Problem Relation Age of Onset   Heart attack Father    Breast cancer Sister    Arthritis Maternal Grandmother    Social History   Socioeconomic History   Marital status: Married    Spouse name: Not on file   Number of  children: Not on file   Years of education: Not on file   Highest education level: Not on file  Occupational History   Not on file  Tobacco Use   Smoking status: Former    Types: Cigarettes    Start date: 03/14/2017    Passive exposure: Never   Smokeless tobacco: Never  Vaping Use   Vaping status: Never Used  Substance and Sexual Activity   Alcohol use: Yes    Alcohol/week: 2.0 standard drinks of alcohol    Types: 2 Standard drinks or equivalent per week    Comment: couple daily   Drug use: No   Sexual activity: Not on file  Other Topics Concern   Not on file  Social History Narrative   Lives in [place] with spouse.   Retired Medical illustrator   Social Determinants of Corporate investment banker Strain: Low Risk  (08/11/2022)   Overall Financial Resource Strain (CARDIA)    Difficulty of Paying Living Expenses: Not hard at all  Food Insecurity: No Food Insecurity (08/11/2022)   Hunger Vital Sign    Worried About Running Out of Food in the Last Year: Never true    Ran Out of Food in the Last Year: Never true  Transportation Needs: No Transportation Needs (08/11/2022)   PRAPARE - Administrator, Civil Service (Medical): No    Lack of Transportation (Non-Medical): No  Physical Activity: Insufficiently Active (08/11/2022)   Exercise Vital Sign    Days of Exercise per Week: 1 day    Minutes of Exercise per Session: 10 min  Stress: No Stress Concern Present (08/11/2022)   [employer] of Occupational Health - Occupational Stress Questionnaire    Feeling of Stress : Not at all  Social Connections: Unknown (08/11/2022)   Social Connection and Isolation Panel [NHANES]    Frequency of Communication with Friends and Family: Never    Frequency of Social Gatherings with Friends and Family: Never    Attends Religious Services: Not on Marketing executive or Organizations: No    Attends Banker Meetings: Never    Marital Status: Married    Tobacco  Counseling Counseling given: Not Answered   Clinical Intake:  Pre-visit preparation completed: Yes  Pain : No/denies pain     Nutritional Risks: None Diabetes: No  How often do you need to have someone help you when you read instructions, pamphlets, or other written materials from your doctor or pharmacy?: 1 - Never  Interpreter Needed?: No  Information entered by :: NAllen LPN   Activities of Daily Living    08/11/2022   12:09 PM 06/15/2022    7:15 AM  In your present state of health, do you have any difficulty performing the following activities:  Hearing? 0   Vision? 0   Difficulty concentrating or making decisions? 0   Walking or climbing stairs? 0   Dressing or bathing? 0   Doing errands, shopping? 0 0  Preparing Food and eating ? N   Using the Toilet? N  In the past six months, have you accidently leaked urine? Y   Comment resolved   Do you have problems with loss of bowel control? Y   Comment has resolved   Managing your Medications? N   Managing your Finances? N   Housekeeping or managing your Housekeeping? N     Patient Care Team: Lorre Munroe, NP as PCP - General (Internal Medicine) Lanier Prude, MD as PCP - Electrophysiology (Cardiology) Christell Constant, MD as PCP - Cardiology (Cardiology) Ronney Asters, Jackelyn Poling, RPH-CPP (Pharmacist)  Indicate any recent Medical Services you may have received from other than [facility] providers in the past year (date may be approximate).     Assessment:   This is a routine wellness examination for Eula.  Hearing/Vision screen Hearing Screening - Comments:: Denies hearing issues Vision Screening - Comments:: Regular eye exams, [facility]  Dietary issues and exercise activities discussed:     Goals Addressed             This Visit's Progress    Patient Stated       08/18/2022, wants to walk regularly       Depression Screen    08/18/2022   12:09 PM 01/10/2022   10:30 AM 09/21/2021    11:47 AM 01/05/2021    1:39 PM 03/19/2019   10:53 AM 12/04/2017    9:39 AM 03/14/2017    2:53 PM  PHQ 2/9 Scores  PHQ - 2 Score 0 0 0 0 0 0 0  PHQ- 9 Score 0          Fall Risk    08/11/2022   12:09 PM 01/10/2022   10:30 AM 03/19/2019   10:53 AM 12/04/2017    9:39 AM 03/14/2017    2:53 PM  Fall Risk   Falls in the past year? 0 0 0 0 No  Number falls in past yr: 0      Injury with Fall? 0 0     Risk for fall due to : Medication side effect      Follow up Falls prevention discussed;Falls evaluation completed        MEDICARE RISK AT HOME:  Medicare Risk at Home - 08/18/22 1209     Any stairs in or around the home? Yes    If so, are there any without handrails? Yes    Home free of loose throw rugs in walkways, pet beds, electrical cords, etc? Yes    Adequate lighting in your home to reduce risk of falls? Yes    Life alert? No    Use of a cane, walker or w/c? No    Grab bars in the bathroom? No    Shower chair or bench in shower? No    Elevated toilet seat or a handicapped toilet? No             TIMED UP AND GO:  Was the test performed?  No    Cognitive Function:        08/18/2022   12:11 PM  6CIT Screen  What Year? 0 points  What month? 0 points  What time? 0 points  Count back from 20 0 points  Months in reverse 0 points  Repeat phrase 2 points  Total Score 2 points    Immunizations Immunization History  Administered Date(s) Administered   COVID-19, mRNA, vaccine(Comirnaty)12 years and older 10/28/2021   Fluad Quad(high Dose 65+) 09/18/2018, 10/28/2021   PFIZER(Purple Top)SARS-COV-2 Vaccination 02/11/2019, 03/08/2019, 11/02/2019  PNEUMOCOCCAL CONJUGATE-20 01/10/2022   Pneumococcal Conjugate-13 02/08/2014   Pneumococcal Polysaccharide-23 03/05/2015   Respiratory Syncytial Virus Vaccine,Recomb Aduvanted(Arexvy) 12/22/2021    TDAP status: Due, Education has been provided regarding the importance of this vaccine. Advised may receive this vaccine at local  pharmacy or Health Dept. Aware to provide a copy of the vaccination record if obtained from local pharmacy or Health Dept. Verbalized acceptance and understanding.  Flu Vaccine status: Due, Education has been provided regarding the importance of this vaccine. Advised may receive this vaccine at local pharmacy or Health Dept. Aware to provide a copy of the vaccination record if obtained from local pharmacy or Health Dept. Verbalized acceptance and understanding.  Pneumococcal vaccine status: Up to date  Covid-19 vaccine status: Completed vaccines  Qualifies for Shingles Vaccine? Yes   Zostavax completed No   Shingrix Completed?: No.    Education has been provided regarding the importance of this vaccine. Patient has been advised to call insurance company to determine out of pocket expense if they have not yet received this vaccine. Advised may also receive vaccine at local pharmacy or Health Dept. Verbalized acceptance and understanding.  Screening Tests Health Maintenance  Topic Date Due   DTaP/Tdap/Td (1 - Tdap) Never done   Zoster Vaccines- Shingrix (1 of 2) Never done   COVID-19 Vaccine (5 - 2023-24 season) 02/28/2022   INFLUENZA VACCINE  08/04/2022   Medicare Annual Wellness (AWV)  08/18/2023   Pneumonia Vaccine 74+ Years old  Completed   Hepatitis C Screening  Completed   HPV VACCINES  Aged Out    Health Maintenance  Health Maintenance Due  Topic Date Due   DTaP/Tdap/Td (1 - Tdap) Never done   Zoster Vaccines- Shingrix (1 of 2) Never done   COVID-19 Vaccine (5 - 2023-24 season) 02/28/2022   INFLUENZA VACCINE  08/04/2022    Colorectal cancer screening: No longer required.   Lung Cancer Screening: (Low Dose CT Chest recommended if Age 17-80 years, 20 pack-year currently smoking OR have quit w/in 15years.) does not qualify.   Lung Cancer Screening Referral: no  Additional Screening:  Hepatitis C Screening: does qualify; Completed 12/04/2017  Vision Screening: Recommended  annual ophthalmology exams for early detection of glaucoma and other disorders of the eye. Is the patient up to date with their annual eye exam?  Yes  Who is the provider or what is the name of the office in which the patient attends annual eye exams? [facility] If pt is not established with a provider, would they like to be referred to a provider to establish care? No .   Dental Screening: Recommended annual dental exams for proper oral hygiene  Diabetic Foot Exam: n/a  Community Resource Referral / Chronic Care Management: CRR required this visit?  No   CCM required this visit?  No     Plan:     I have personally reviewed and noted the following in the patient's chart:   Medical and social history Use of alcohol, tobacco or illicit drugs  Current medications and supplements including opioid prescriptions. Patient is not currently taking opioid prescriptions. Functional ability and status Nutritional status Physical activity Advanced directives List of other physicians Hospitalizations, surgeries, and ER visits in previous 12 months Vitals Screenings to include cognitive, depression, and falls Referrals and appointments  In addition, I have reviewed and discussed with patient certain preventive protocols, quality metrics, and best practice recommendations. A written personalized care plan for preventive services as well as general preventive  health recommendations were provided to patient.     Barb Merino, LPN   1/61/0960   After Visit Summary: (MyChart) Due to this being a telephonic visit, the after visit summary with patients personalized plan was offered to patient via MyChart   Nurse Notes: none

## 2022-08-18 NOTE — Patient Instructions (Signed)
Kent James , Thank you for taking time to come for your Medicare Wellness Visit. I appreciate your ongoing commitment to your health goals. Please review the following plan we discussed and let me know if I can assist you in the future.   Referrals/Orders/Follow-Ups/Clinician Recommendations: none  This is a list of the screening recommended for you and due dates:  Health Maintenance  Topic Date Due   DTaP/Tdap/Td vaccine (1 - Tdap) Never done   Zoster (Shingles) Vaccine (1 of 2) Never done   COVID-19 Vaccine (5 - 2023-24 season) 02/28/2022   Flu Shot  08/04/2022   Medicare Annual Wellness Visit  08/18/2023   Pneumonia Vaccine  Completed   Hepatitis C Screening  Completed   HPV Vaccine  Aged Out    Advanced directives: (ACP Link)Information on Advanced Care Planning can be found at [facility] Advance Health Care Directives Advance Health Care Directives ([email or web address]   Next Medicare Annual Wellness Visit scheduled for next year: Yes  Preventive Care 65 Years and Older, Male  Preventive care refers to lifestyle choices and visits with your health care provider that can promote health and wellness. What does preventive care include? A yearly physical exam. This is also called an annual well check. Dental exams once or twice a year. Routine eye exams. Ask your health care provider how often you should have your eyes checked. Personal lifestyle choices, including: Daily care of your teeth and gums. Regular physical activity. Eating a healthy diet. Avoiding tobacco and drug use. Limiting alcohol use. Practicing safe sex. Taking low doses of aspirin every day. Taking vitamin and mineral supplements as recommended by your health care provider. What happens during an annual well check? The services and screenings done by your health care provider during your annual well check will depend on your age, overall health, lifestyle risk factors, and family history of  disease. Counseling  Your health care provider may ask you questions about your: Alcohol use. Tobacco use. Drug use. Emotional well-being. Home and relationship well-being. Sexual activity. Eating habits. History of falls. Memory and ability to understand (cognition). Work and work Astronomer. Screening  You may have the following tests or measurements: Height, weight, and BMI. Blood pressure. Lipid and cholesterol levels. These may be checked every 5 years, or more frequently if you are over 52 years old. Skin check. Lung cancer screening. You may have this screening every year starting at age 2 if you have a 30-pack-year history of smoking and currently smoke or have quit within the past 15 years. Fecal occult blood test (FOBT) of the stool. You may have this test every year starting at age 41. Flexible sigmoidoscopy or colonoscopy. You may have a sigmoidoscopy every 5 years or a colonoscopy every 10 years starting at age 61. Prostate cancer screening. Recommendations will vary depending on your family history and other risks. Hepatitis C blood test. Hepatitis B blood test. Sexually transmitted disease (STD) testing. Diabetes screening. This is done by checking your blood sugar (glucose) after you have not eaten for a while (fasting). You may have this done every 1-3 years. Abdominal aortic aneurysm (AAA) screening. You may need this if you are a current or former smoker. Osteoporosis. You may be screened starting at age 21 if you are at high risk. Talk with your health care provider about your test results, treatment options, and if necessary, the need for more tests. Vaccines  Your health care provider may recommend certain vaccines, such as: Influenza  vaccine. This is recommended every year. Tetanus, diphtheria, and acellular pertussis (Tdap, Td) vaccine. You may need a Td booster every 10 years. Zoster vaccine. You may need this after age 28. Pneumococcal 13-valent  conjugate (PCV13) vaccine. One dose is recommended after age 86. Pneumococcal polysaccharide (PPSV23) vaccine. One dose is recommended after age 52. Talk to your health care provider about which screenings and vaccines you need and how often you need them. This information is not intended to replace advice given to you by your health care provider. Make sure you discuss any questions you have with your health care provider. Document Released: 01/16/2015 Document Revised: 09/09/2015 Document Reviewed: 10/21/2014 Elsevier Interactive Patient Education  2017 [employer].  Fall Prevention in the Home Falls can cause injuries. They can happen to people of all ages. There are many things you can do to make your home safe and to help prevent falls. What can I do on the outside of my home? Regularly fix the edges of walkways and driveways and fix any cracks. Remove anything that might make you trip as you walk through a door, such as a raised step or threshold. Trim any bushes or trees on the path to your home. Use bright outdoor lighting. Clear any walking paths of anything that might make someone trip, such as rocks or tools. Regularly check to see if handrails are loose or broken. Make sure that both sides of any steps have handrails. Any raised decks and porches should have guardrails on the edges. Have any leaves, snow, or ice cleared regularly. Use sand or salt on walking paths during winter. Clean up any spills in your garage right away. This includes oil or grease spills. What can I do in the bathroom? Use night lights. Install grab bars by the toilet and in the tub and shower. Do not use towel bars as grab bars. Use non-skid mats or decals in the tub or shower. If you need to sit down in the shower, use a plastic, non-slip stool. Keep the floor dry. Clean up any water that spills on the floor as soon as it happens. Remove soap buildup in the tub or shower regularly. Attach bath mats  securely with double-sided non-slip rug tape. Do not have throw rugs and other things on the floor that can make you trip. What can I do in the bedroom? Use night lights. Make sure that you have a light by your bed that is easy to reach. Do not use any sheets or blankets that are too big for your bed. They should not hang down onto the floor. Have a firm chair that has side arms. You can use this for support while you get dressed. Do not have throw rugs and other things on the floor that can make you trip. What can I do in the kitchen? Clean up any spills right away. Avoid walking on wet floors. Keep items that you use a lot in easy-to-reach places. If you need to reach something above you, use a strong step stool that has a grab bar. Keep electrical cords out of the way. Do not use floor polish or wax that makes floors slippery. If you must use wax, use non-skid floor wax. Do not have throw rugs and other things on the floor that can make you trip. What can I do with my stairs? Do not leave any items on the stairs. Make sure that there are handrails on both sides of the stairs and use them. Fix  handrails that are broken or loose. Make sure that handrails are as long as the stairways. Check any carpeting to make sure that it is firmly attached to the stairs. Fix any carpet that is loose or worn. Avoid having throw rugs at the top or bottom of the stairs. If you do have throw rugs, attach them to the floor with carpet tape. Make sure that you have a light switch at the top of the stairs and the bottom of the stairs. If you do not have them, ask someone to add them for you. What else can I do to help prevent falls? Wear shoes that: Do not have high heels. Have rubber bottoms. Are comfortable and fit you well. Are closed at the toe. Do not wear sandals. If you use a stepladder: Make sure that it is fully opened. Do not climb a closed stepladder. Make sure that both sides of the stepladder  are locked into place. Ask someone to hold it for you, if possible. Clearly mark and make sure that you can see: Any grab bars or handrails. First and last steps. Where the edge of each step is. Use tools that help you move around (mobility aids) if they are needed. These include: Canes. Walkers. Scooters. Crutches. Turn on the lights when you go into a dark area. Replace any light bulbs as soon as they burn out. Set up your furniture so you have a clear path. Avoid moving your furniture around. If any of your floors are uneven, fix them. If there are any pets around you, be aware of where they are. Review your medicines with your doctor. Some medicines can make you feel dizzy. This can increase your chance of falling. Ask your doctor what other things that you can do to help prevent falls. This information is not intended to replace advice given to you by your health care provider. Make sure you discuss any questions you have with your health care provider. Document Released: 10/16/2008 Document Revised: 05/28/2015 Document Reviewed: 01/24/2014 Elsevier Interactive Patient Education  2017 [employer].

## 2022-08-19 ENCOUNTER — Ambulatory Visit (HOSPITAL_COMMUNITY)
Admission: RE | Admit: 2022-08-19 | Discharge: 2022-08-19 | Disposition: A | Discharge status: Home / Self Care | Payer: Medicare HMO | Source: Ambulatory Visit | Attending: Cardiology | Admitting: Cardiology

## 2022-08-19 DIAGNOSIS — Z95818 Presence of other cardiac implants and grafts: Secondary | ICD-10-CM | POA: Diagnosis not present

## 2022-08-19 DIAGNOSIS — I480 Paroxysmal atrial fibrillation: Secondary | ICD-10-CM | POA: Diagnosis not present

## 2022-08-19 MED ORDER — IOHEXOL 350 MG/ML SOLN
100.0000 mL | Freq: Once | INTRAVENOUS | Status: AC | PRN
  Administered 2022-08-19: 100 mL via INTRAVENOUS

## 2022-09-05 ENCOUNTER — Encounter: Payer: Self-pay | Admitting: Internal Medicine

## 2022-09-13 ENCOUNTER — Ambulatory Visit
Admission: RE | Admit: 2022-09-13 | Discharge: 2022-09-13 | Disposition: A | Discharge status: Home / Self Care | Payer: Medicare HMO | Source: Ambulatory Visit | Attending: Otolaryngology | Admitting: Otolaryngology

## 2022-09-13 ENCOUNTER — Other Ambulatory Visit: Payer: Self-pay | Admitting: Otolaryngology

## 2022-09-13 DIAGNOSIS — R0602 Shortness of breath: Secondary | ICD-10-CM

## 2022-09-13 DIAGNOSIS — D140 Benign neoplasm of middle ear, nasal cavity and accessory sinuses: Secondary | ICD-10-CM | POA: Diagnosis not present

## 2022-09-13 DIAGNOSIS — J320 Chronic maxillary sinusitis: Secondary | ICD-10-CM | POA: Diagnosis not present

## 2022-09-13 DIAGNOSIS — I700 Atherosclerosis of aorta: Secondary | ICD-10-CM | POA: Diagnosis not present

## 2022-09-13 DIAGNOSIS — R053 Chronic cough: Secondary | ICD-10-CM | POA: Diagnosis not present

## 2022-09-13 DIAGNOSIS — Z95818 Presence of other cardiac implants and grafts: Secondary | ICD-10-CM | POA: Diagnosis not present

## 2022-09-16 ENCOUNTER — Other Ambulatory Visit: Payer: Self-pay | Admitting: Otolaryngology

## 2022-09-16 DIAGNOSIS — R0602 Shortness of breath: Secondary | ICD-10-CM

## 2022-09-16 DIAGNOSIS — R1310 Dysphagia, unspecified: Secondary | ICD-10-CM

## 2022-09-20 ENCOUNTER — Ambulatory Visit
Admission: RE | Admit: 2022-09-20 | Discharge: 2022-09-20 | Disposition: A | Discharge status: Home / Self Care | Payer: Medicare HMO | Source: Ambulatory Visit | Attending: Otolaryngology | Admitting: Otolaryngology

## 2022-09-20 DIAGNOSIS — R053 Chronic cough: Secondary | ICD-10-CM | POA: Diagnosis not present

## 2022-09-20 DIAGNOSIS — R0602 Shortness of breath: Secondary | ICD-10-CM | POA: Diagnosis present

## 2022-09-20 DIAGNOSIS — R1310 Dysphagia, unspecified: Secondary | ICD-10-CM | POA: Insufficient documentation

## 2022-09-20 NOTE — Therapy (Signed)
Modified Barium Swallow Study  Patient Details  Name: Mccall Beauchesne MRN: [id number] Date of Birth: [birth year]  Today's Date: 09/20/2022  Modified Barium Swallow completed.  Full report located under Chart Review in the Imaging Section.  History of Present Illness Pt is a 77 year old male who was referred by his ENT (Dr Bud Face) for shortness of breath and chronic cough. Chest x-ray 09/13/2022 was negative for any acute cardiopulmonary abnormality. Pt states that he has history of reflux, is taking medicine for it daily but "it has been a while" since he has seen GI.   Clinical Impression Pt describes coughing as intermittent and can happen "on my own salvia." Pt presents with adequate oropharyngeal abilities when consuming thin liquids via cup and straw, nectar thick liquids, puree, graham cracker with barium paste as well as a whole barium tablet with thin liquids. As such, he is at reduced risk of aspiration when consuming PO intake. The results of this assessment were shared with pt. All questions answered to his satisfaction. Continue to recommend regular diet with thin liquids via cup or straw and medicine whole with thin liquids. Factors that may increase risk of adverse event in presence of aspiration Rubye Oaks & Clearance Coots 2021): Respiratory or GI disease  Swallow Evaluation Recommendations Recommendations: PO diet PO Diet Recommendation: Regular;Thin liquids (Level 0) Liquid Administration via: Cup;Straw Medication Administration: Whole meds with liquid Supervision: Patient able to self-feed Swallowing strategies  : Minimize environmental distractions;Slow rate;Small bites/sips Postural changes: Position pt fully upright for meals;Stay upright 30-60 min after meals Oral care recommendations: Oral care BID (2x/day)    Staphanie Harbison B. Dreama Saa, M.S., CCC-SLP, Tree surgeon Certified Brain Injury Specialist [facility]  [facility] Office [phone] Ascom [phone] Fax [phone]

## 2022-11-30 ENCOUNTER — Ambulatory Visit: Payer: Medicare HMO | Admitting: Student

## 2022-12-09 NOTE — Progress Notes (Unsigned)
[facility]                                     Cardiology Office Note:    Date:  12/12/2022   ID:  Kent James, DOB September 22, 1945, MRN [id number]  PCP:  Lorre Munroe, NP  [facility] Cardiologist:  Christell Constant, MD  [facility] Electrophysiologist:  Lanier Prude, MD   Referring MD: Lorre Munroe, NP   Chief Complaint  Patient presents with   6 month s/p LAAO    History of Present Illness:    Kent James is a 77 y.o. male with a hx of HTN, HLD, CAD, GERD, morbid obesity, paroxysmal atrial fibrillation s/p LAAO (06/15/22) who presents to clinic for follow up.    He was initially referred to cardiology for the evaluation of atrial fibrillation in 2018 after presenting to the ED with symptoms of SOB and palpitations. He underwent DCCV x2 and was started on sotalol and anticoagulation and was eventually seen by our EP team. He had an AF ablation 12/11/2018 and was eventually taken off his AAD therapies. He wished to be evaluated for LAAO closure with Watchman to avoid long term anticoagulation due to increased bleeding risks, moderate AC compliance, and cost associated with long term therapy. He underwent left atrial appendage occlusive device placement with 24mm  Watchman FLX Pro device on 06/15/22. He was continued on Eliquis 5mg  PO BID for 45 days after implant and then transitioned to Plavix 75mg  PO daily to complete 6 months of post implant medical therapy. Post op CT with no leak or device thrombus.     Today he is here alone and reports that from a Watchman standpoint he is doing well. He is scheduled to see Dr. Lalla Brothers 12/19 to further discuss repeat ablation. Patient reports going back into AF several months back and has remained in it (from what he can tell) sine then. He will check his radial pulse on occasion and states it is irregular although rates not necessarily elevated. With this he is also reports increased SOB with exertion. No other  overt HF symptoms such as LE edema orthopnea. He denies chest pain, dizziness, syncope, and no bleeding in stool or urine.     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 Lalla Brothers   Snoring     Past Surgical History:  Procedure Laterality Date   ATRIAL FIBRILLATION ABLATION N/A 12/11/2018   Procedure: ATRIAL FIBRILLATION ABLATION;  Surgeon: Hillis Range, MD;  Location: [facility];  Service: Cardiovascular;  Laterality: N/A;   CARDIOVERSION     IMAGE GUIDED SINUS SURGERY N/A 10/29/2019   Procedure: IMAGE GUIDED SINUS SURGERY;  Surgeon: Bud Face, MD;  Location: [facility] ORS;  Service: ENT;  Laterality: N/A;   LEFT ATRIAL APPENDAGE OCCLUSION N/A 06/15/2022   Procedure: LEFT ATRIAL APPENDAGE OCCLUSION;  Surgeon: Lanier Prude, MD;  Location: [facility];  Service: Cardiovascular;  Laterality: N/A;   MAXILLARY ANTROSTOMY Right 10/29/2019   Procedure: MAXILLARY ANTROSTOMY;  Surgeon: Bud Face, MD;  Location: [facility] ORS;  Service: ENT;  Laterality: Right;   TEE WITHOUT CARDIOVERSION N/A 12/11/2018   Procedure: TRANSESOPHAGEAL ECHOCARDIOGRAM (TEE);  Surgeon: Parke Poisson, MD;  Location: [facility] ENDOSCOPY;  Service: Cardiology;  Laterality: N/A;   TEE WITHOUT CARDIOVERSION N/A 06/15/2022   Procedure: TRANSESOPHAGEAL ECHOCARDIOGRAM;  Surgeon: Lanier Prude, MD;  Location: [facility] INVASIVE CV LAB;  Service: Cardiovascular;  Laterality: N/A;    Current Medications: Current Meds  Medication Sig   aspirin EC 81 MG tablet Take 1 tablet (81 mg total) by mouth daily. Swallow whole.   lisinopril (ZESTRIL) 40 MG tablet Take 1 tablet (40 mg total) by mouth daily.    pantoprazole (PROTONIX) 20 MG tablet Take 1 tablet (20 mg total) by mouth daily.   rosuvastatin (CRESTOR) 5 MG tablet Take 1 tablet (5 mg total) by mouth daily. (Patient taking differently: Take 5 mg by mouth every evening.)   [DISCONTINUED] clopidogrel (PLAVIX) 75 MG tablet Take 1 tablet (75 mg total) by mouth daily.     Allergies:   Patient has no known allergies.   Social History   Socioeconomic History   Marital status: Married    Spouse name: Not on file   Number of children: Not on file   Years of education: Not on file   Highest education level: Not on file  Occupational History   Not on file  Tobacco Use   Smoking status: Former    Types: Cigarettes    Start date: 03/14/2017    Passive exposure: Never   Smokeless tobacco: Never  Vaping Use   Vaping status: Never Used  Substance and Sexual Activity   Alcohol use: Yes    Alcohol/week: 2.0 standard drinks of alcohol    Types: 2 Standard drinks or equivalent per week    Comment: couple daily   Drug use: No   Sexual activity: Not on file  Other Topics Concern   Not on file  Social History Narrative   Lives in [place] with spouse.   Retired Medical illustrator   Social Determinants of Corporate investment banker Strain: Low Risk  (08/11/2022)   Overall Financial Resource Strain (CARDIA)    Difficulty of Paying Living Expenses: Not hard at all  Food Insecurity: No Food Insecurity (08/11/2022)   Hunger Vital Sign    Worried About Running Out of Food in the Last Year: Never true    Ran Out of Food in the Last Year: Never true  Transportation Needs: No Transportation Needs (08/11/2022)   PRAPARE - Administrator, Civil Service (Medical): No    Lack of Transportation (Non-Medical): No  Physical Activity: Insufficiently Active (08/11/2022)   Exercise Vital Sign    Days of Exercise per Week: 1 day    Minutes of Exercise per Session: 10 min  Stress: No Stress Concern Present (08/11/2022)   [employer] of Occupational  Health - Occupational Stress Questionnaire    Feeling of Stress : Not at all  Social Connections: Unknown (08/11/2022)   Social Connection and Isolation Panel [NHANES]    Frequency of Communication with Friends and Family: Never    Frequency of Social Gatherings with Friends and Family: Never    Attends Religious Services: Not on Marketing executive or Organizations: No    Attends Banker Meetings: Never    Marital Status: Married     Family History: The patient's family history includes Arthritis in his maternal grandmother; Breast cancer in his  sister; Heart attack in his father.  ROS:   Please see the history of present illness.    All other systems reviewed and are negative.  EKGs/Labs/Other Studies Reviewed:    The following studies were reviewed today:  Cardiac Studies & Procedures       ECHOCARDIOGRAM  ECHOCARDIOGRAM COMPLETE 03/17/2022  Narrative ECHOCARDIOGRAM REPORT    Patient Name:   Adarious Shamel Date of Exam: 03/17/2022 Medical Rec #:  [id number]      Height:       71.0 in Accession #:    [id number]     Weight:       253.0 lb Date of Birth:  23-Jun-1945      BSA:          2.330 m Patient Age:    76 years       BP:           16+0/90 mmHg Patient Gender: M              HR:           67 bpm. Exam Location:  [address]  Procedure: 2D Echo, Cardiac Doppler and Color Doppler  Indications:    I48.0 Atrial Fibrillation  History:        Patient has prior history of Echocardiogram examinations, most recent 11/12/2018. Arrythmias:Atrial Fibrillation; Risk Factors:Hypertension, Dyslipidemia, Family History of Coronary Artery Disease and Former Smoker. Pre-Operative Eval for WATCHMAN implantation, status post A-FIb Ablation.  Sonographer:    Farrel Conners RDCS Referring Phys: Lanier Prude  IMPRESSIONS   1. Left ventricular ejection fraction, by estimation, is 60 to 65%. The left ventricle has normal function. The left ventricle  has no regional wall motion abnormalities. Left ventricular diastolic parameters were normal. 2. Right ventricular systolic function is normal. The right ventricular size is normal. 3. The mitral valve is normal in structure. Trivial mitral valve regurgitation. No evidence of mitral stenosis. 4. The aortic valve is tricuspid. Aortic valve regurgitation is mild. No aortic stenosis is present. Aortic regurgitation PHT measures 639 msec. 5. Aortic dilatation noted. There is mild dilatation of the ascending aorta, measuring 40 mm. 6. The inferior vena cava is normal in size with greater than 50% respiratory variability, suggesting right atrial pressure of 3 mmHg.  FINDINGS Left Ventricle: Left ventricular ejection fraction, by estimation, is 60 to 65%. The left ventricle has normal function. The left ventricle has no regional wall motion abnormalities. The left ventricular internal cavity size was normal in size. There is no left ventricular hypertrophy. Left ventricular diastolic parameters were normal. Normal left ventricular filling pressure.  Right Ventricle: The right ventricular size is normal. No increase in right ventricular wall thickness. Right ventricular systolic function is normal.  Left Atrium: Left atrial size was normal in size.  Right Atrium: Right atrial size was normal in size.  Pericardium: There is no evidence of pericardial effusion.  Mitral Valve: The mitral valve is normal in structure. Trivial mitral valve regurgitation. No evidence of mitral valve stenosis.  Tricuspid Valve: The tricuspid valve is normal in structure. Tricuspid valve regurgitation is not demonstrated. No evidence of tricuspid stenosis.  Aortic Valve: The aortic valve is tricuspid. Aortic valve regurgitation is mild. Aortic regurgitation PHT measures 639 msec. No aortic stenosis is present.  Pulmonic Valve: The pulmonic valve was normal in structure. Pulmonic valve regurgitation is trivial. No evidence  of pulmonic stenosis.  Aorta: Aortic dilatation noted. There is mild dilatation of the ascending aorta, measuring 40 mm.  Venous: The inferior vena cava is normal in size with greater than 50% respiratory variability, suggesting right atrial pressure of 3 mmHg.  IAS/Shunts: No atrial level shunt detected by color flow Doppler.   LEFT VENTRICLE PLAX 2D LVIDd:         4.60 cm   Diastology LVIDs:         2.60 cm   LV e' medial:    8.38 cm/s LV PW:         0.80 cm   LV E/e' medial:  9.8 LV IVS:        0.90 cm   LV e' lateral:   11.60 cm/s LVOT diam:     2.30 cm   LV E/e' lateral: 7.1 LV SV:         85 LV SV Index:   36 LVOT Area:     4.15 cm   RIGHT VENTRICLE RV Basal diam:  3.60 cm RV S prime:     10.00 cm/s TAPSE (M-mode): 1.1 cm  LEFT ATRIUM             Index        RIGHT ATRIUM           Index LA diam:        4.40 cm 1.89 cm/m   RA Pressure: 3.00 mmHg LA Vol (A2C):   67.3 ml 28.88 ml/m  RA Area:     11.10 cm LA Vol (A4C):   60.4 ml 25.92 ml/m  RA Volume:   21.30 ml  9.14 ml/m LA Biplane Vol: 63.4 ml 27.21 ml/m AORTIC VALVE LVOT Vmax:   90.65 cm/s LVOT Vmean:  60.550 cm/s LVOT VTI:    0.204 m AI PHT:      639 msec  AORTA Ao Root diam: 3.50 cm Ao Asc diam:  4.00 cm  MITRAL VALVE               TRICUSPID VALVE MV Area (PHT)  cm         Estimated RAP:  3.00 mmHg MV Decel Time: 213 msec MV E velocity: 82.30 cm/s  SHUNTS MV A velocity: 73.35 cm/s  Systemic VTI:  0.20 m MV E/A ratio:  1.12        Systemic Diam: 2.30 cm  Chilton Si MD Electronically signed by Chilton Si MD Signature Date/Time: 03/17/2022/3:44:40 PM    Final   TEE  ECHO TEE 06/15/2022  Narrative TRANSESOPHOGEAL ECHO REPORT    Patient Name:   PRAVEEN TRZCINSKI Date of Exam: 06/15/2022 Medical Rec #:  [id number]      Height:       70.0 in Accession #:    [id number]     Weight:       247.0 lb Date of Birth:  [birth year]      BSA:          2.283 m Patient Age:    76 years       BP:            154/88 mmHg Patient Gender: M              HR:           70 bpm. Exam Location:  Inpatient  Procedure: Transesophageal Echo, 3D Echo, Color Doppler and Cardiac Doppler  Indications:     I48.2 Chronic atrial fibrillation  History:         Patient has prior history of Echocardiogram examinations, most recent 03/17/2022. Arrythmias:Atrial Fibrillation; Risk Factors:Hypertension  and Dyslipidemia.  Sonographer:     Irving Burton Senior RDCS Referring Phys:  [id number] Lanier Prude Diagnosing Phys: Charlton Haws MD   Sonographer Comments: Watchman Procedure   PROCEDURE: After discussion of the risks and benefits of a TEE, an informed consent was obtained from the patient. The transesophogeal probe was passed without difficulty through the esophogus of the patient. Sedation performed by different physician. The patient was monitored while under deep sedation. The patient's vital signs; including heart rate, blood pressure, and oxygen saturation; remained stable throughout the procedure. The patient developed no complications during the procedure.  IMPRESSIONS   1. Well placed 24 mm Watchman FLX device Negative tugg test No leak by color flow Average compression 12%. 2. Left ventricular ejection fraction, by estimation, is 60 to 65%. The left ventricle has normal function. The left ventricle has no regional wall motion abnormalities. 3. Right ventricular systolic function is normal. The right ventricular size is normal. 4. No left atrial/left atrial appendage thrombus was detected. 5. The mitral valve is abnormal. Trivial mitral valve regurgitation. No evidence of mitral stenosis. 6. The aortic valve is tricuspid. Aortic valve regurgitation is mild. No aortic stenosis is present. 7. The inferior vena cava is normal in size with greater than 50% respiratory variability, suggesting right atrial pressure of 3 mmHg. 8. Left to right shunt only post trans septal  puncture.  Conclusion(s)/Recommendation(s): Normal biventricular function without evidence of hemodynamically significant valvular heart disease.  FINDINGS Left Ventricle: Left ventricular ejection fraction, by estimation, is 60 to 65%. The left ventricle has normal function. The left ventricle has no regional wall motion abnormalities. The left ventricular internal cavity size was normal in size. There is no left ventricular hypertrophy.  Right Ventricle: The right ventricular size is normal. No increase in right ventricular wall thickness. Right ventricular systolic function is normal.  Left Atrium: Left atrial size was normal in size. No left atrial/left atrial appendage thrombus was detected.  Right Atrium: Right atrial size was normal in size.  Pericardium: There is no evidence of pericardial effusion.  Mitral Valve: The mitral valve is abnormal. There is mild thickening of the mitral valve leaflet(s). Trivial mitral valve regurgitation. No evidence of mitral valve stenosis.  Tricuspid Valve: The tricuspid valve is normal in structure. Tricuspid valve regurgitation is mild . No evidence of tricuspid stenosis.  Aortic Valve: The aortic valve is tricuspid. Aortic valve regurgitation is mild. No aortic stenosis is present. Aortic valve mean gradient measures 2.0 mmHg. Aortic valve peak gradient measures 5.1 mmHg.  Pulmonic Valve: The pulmonic valve was normal in structure. Pulmonic valve regurgitation is not visualized. No evidence of pulmonic stenosis.  Aorta: The aortic root is normal in size and structure.  Venous: The inferior vena cava is normal in size with greater than 50% respiratory variability, suggesting right atrial pressure of 3 mmHg.  IAS/Shunts: Left to right shunt only post trans septal puncture.  Additional Comments: Well placed 24 mm Watchman FLX device Negative tugg test No leak by color flow Average compression 12%. Spectral Doppler performed.  AORTIC VALVE AV  Vmax:      113.00 cm/s AV Vmean:     70.700 cm/s AV VTI:       0.211 m AV Peak Grad: 5.1 mmHg AV Mean Grad: 2.0 mmHg  Charlton Haws MD Electronically signed by Charlton Haws MD Signature Date/Time: 06/15/2022/11:36:40 AM    Final            EKG:  EKG is ordered today.  The ekg ordered today demonstrates atrial fibrillation with HR 87bpm   Recent Labs: 01/10/2022: ALT 14 05/20/2022: Hemoglobin 17.1; Platelets 196 08/12/2022: BUN 11; Creatinine, Ser 1.05; Potassium 3.8; Sodium 139   Recent Lipid Panel    Component Value Date/Time   CHOL 188 01/10/2022 1034   TRIG 117 01/10/2022 1034   HDL 44 01/10/2022 1034   CHOLHDL 4.3 01/10/2022 1034   VLDL 14.4 03/19/2019 1116   LDLCALC 122 (H) 01/10/2022 1034   Risk Assessment/Calculations:    HAS-BLED score 2 Hypertension Yes  Abnormal renal and liver function (Dialysis, transplant, Cr >2.26 mg/dL /Cirrhosis or Bilirubin >2x Normal or AST/ALT/AP >3x Normal) No  Stroke No  Bleeding No  Labile INR (Unstable/high INR) No  Elderly (>65) Yes  Drugs or alcohol (>= 8 drinks/week, anti-plt or NSAID) No    CHA2DS2-VASc Score = 3  The patient's score is based upon: CHF History: 0 HTN History: 1 Diabetes History: 0 Stroke History: 0 Vascular Disease History: 0 Age Score: 2 Gender Score: 0  Physical Exam:    VS:  BP 138/82   Pulse 92   Ht 5' 10.5" (1.791 m)   Wt 251 lb 9.6 oz (114.1 kg)   SpO2 95%   BMI 35.59 kg/m     Wt Readings from Last 3 Encounters:  12/12/22 251 lb 9.6 oz (114.1 kg)  08/12/22 250 lb (113.4 kg)  07/12/22 252 lb 9.6 oz (114.6 kg)    General: Well developed, well nourished, NAD Lungs:Clear to ausculation bilaterally. No wheezes, rales, or rhonchi. Breathing is unlabored. Cardiovascular: Irregular. No murmurs Extremities: No edema.  Neuro: Alert and oriented. No focal deficits. No facial asymmetry. MAE spontaneously. Psych: Responds to questions appropriately with normal affect.    ASSESSMENT/PLAN:     Paroxysmal atrial fibrillation s/p LAAO: Patient is now 6 months out from LAAO and can stop Plavix. Will transition to ASA 81mg  given coronary calcification on CT imaging. Reports recurrent AF and is scheduled to discuss with Dr. Lalla Brothers later this month. EKG today confirmed AF with stable rate at 87bpm. Given SOB, will also obtain an echocardiogram (prior to anesthesia for possible ablation). This is scheduled for 12/10. Keep follow up with Dr. Lalla Brothers.    CAD: No anginal symptoms. Plan indefinite ASA 81mg  given coronary calcifications on CT.   I spent 25 minutes caring for this patient today including face-to-face discussions, ordering and reviewing labs, reviewing records from [facility] and other outside facilities, documenting in the record, and arranging for follow up.     Medication Adjustments/Labs and Tests Ordered: Current medicines are reviewed at length with the patient today.  Concerns regarding medicines are outlined above.  Orders Placed This Encounter  Procedures   EKG 12-Lead   Meds ordered this encounter  Medications   aspirin EC 81 MG tablet    Sig: Take 1 tablet (81 mg total) by mouth daily. Swallow whole.    Patient Instructions  Medication Instructions:  STOP Plavix START Aspirin 81mg  Take 1 tablet once a day  *If you need a refill on your cardiac medications before your next appointment, please call your pharmacy*   Lab Work: None ordered   Testing/Procedures: Your physician has requested that you have an echocardiogram. Echocardiography is a painless test that uses sound waves to create images of your heart. It provides your doctor with information about the size and shape of your heart and how well your heart's chambers and valves are working. This procedure takes approximately one hour. There  are no restrictions for this procedure. Please do NOT wear cologne, perfume, aftershave, or lotions (deodorant is allowed). Please arrive 15 minutes prior to your  appointment time.  Please note: We ask at that you not bring children with you during ultrasound (echo/ vascular) testing. Due to room size and safety concerns, children are not allowed in the ultrasound rooms during exams. Our front office staff cannot provide observation of children in our lobby area while testing is being conducted. An adult accompanying a patient to their appointment will only be allowed in the ultrasound room at the discretion of the ultrasound technician under special circumstances. We apologize for any inconvenience.   Follow-Up: At [facility], you and your health needs are our priority.  As part of our continuing mission to provide you with exceptional heart care, we have created designated Provider Care Teams.  These Care Teams include your primary Cardiologist (physician) and Advanced Practice Providers (APPs -  Physician Assistants and Nurse Practitioners) who all work together to provide you with the care you need, when you need it.  We recommend signing up for the patient portal called "MyChart".  Sign up information is provided on this After Visit Summary.  MyChart is used to connect with patients for Virtual Visits (Telemedicine).  Patients are able to view lab/test results, encounter notes, upcoming appointments, etc.  Non-urgent messages can be sent to your provider as well.   To learn more about what you can do with MyChart, go to [email or web address].    Your next appointment:    FOLLOW UP AS SCHEDULED  Provider:   Steffanie Dunn, MD    Other Instructions     Signed, [name], NP  12/12/2022 10:13 AM    [facility]

## 2022-12-12 ENCOUNTER — Other Ambulatory Visit: Payer: Self-pay | Admitting: Cardiology

## 2022-12-12 ENCOUNTER — Ambulatory Visit: Payer: Medicare HMO | Attending: Cardiology | Admitting: Cardiology

## 2022-12-12 VITALS — BP 138/82 | HR 92 | Ht 70.5 in | Wt 251.6 lb

## 2022-12-12 DIAGNOSIS — R0602 Shortness of breath: Secondary | ICD-10-CM

## 2022-12-12 DIAGNOSIS — I2510 Atherosclerotic heart disease of native coronary artery without angina pectoris: Secondary | ICD-10-CM | POA: Diagnosis not present

## 2022-12-12 DIAGNOSIS — Z95818 Presence of other cardiac implants and grafts: Secondary | ICD-10-CM | POA: Diagnosis not present

## 2022-12-12 DIAGNOSIS — I10 Essential (primary) hypertension: Secondary | ICD-10-CM

## 2022-12-12 DIAGNOSIS — I480 Paroxysmal atrial fibrillation: Secondary | ICD-10-CM

## 2022-12-12 MED ORDER — ASPIRIN 81 MG PO TBEC
81.0000 mg | DELAYED_RELEASE_TABLET | Freq: Every day | ORAL | Status: DC
Start: 2022-12-12 — End: 2022-12-22

## 2022-12-12 NOTE — Patient Instructions (Signed)
Medication Instructions:  STOP Plavix START Aspirin 81mg  Take 1 tablet once a day  *If you need a refill on your cardiac medications before your next appointment, please call your pharmacy*   Lab Work: None ordered   Testing/Procedures: Your physician has requested that you have an echocardiogram. Echocardiography is a painless test that uses sound waves to create images of your heart. It provides your doctor with information about the size and shape of your heart and how well your heart's chambers and valves are working. This procedure takes approximately one hour. There are no restrictions for this procedure. Please do NOT wear cologne, perfume, aftershave, or lotions (deodorant is allowed). Please arrive 15 minutes prior to your appointment time.  Please note: We ask at that you not bring children with you during ultrasound (echo/ vascular) testing. Due to room size and safety concerns, children are not allowed in the ultrasound rooms during exams. Our front office staff cannot provide observation of children in our lobby area while testing is being conducted. An adult accompanying a patient to their appointment will only be allowed in the ultrasound room at the discretion of the ultrasound technician under special circumstances. We apologize for any inconvenience.   Follow-Up: At [facility], you and your health needs are our priority.  As part of our continuing mission to provide you with exceptional heart care, we have created designated Provider Care Teams.  These Care Teams include your primary Cardiologist (physician) and Advanced Practice Providers (APPs -  Physician Assistants and Nurse Practitioners) who all work together to provide you with the care you need, when you need it.  We recommend signing up for the patient portal called "MyChart".  Sign up information is provided on this After Visit Summary.  MyChart is used to connect with patients for Virtual Visits  (Telemedicine).  Patients are able to view lab/test results, encounter notes, upcoming appointments, etc.  Non-urgent messages can be sent to your provider as well.   To learn more about what you can do with MyChart, go to [email or web address].    Your next appointment:    FOLLOW UP AS SCHEDULED  Provider:   Steffanie Dunn, MD    Other Instructions

## 2022-12-13 ENCOUNTER — Ambulatory Visit (HOSPITAL_COMMUNITY): Payer: Medicare HMO | Attending: Cardiology

## 2022-12-13 DIAGNOSIS — R0602 Shortness of breath: Secondary | ICD-10-CM | POA: Insufficient documentation

## 2022-12-13 LAB — ECHOCARDIOGRAM COMPLETE: S' Lateral: 3.86 cm

## 2022-12-20 ENCOUNTER — Ambulatory Visit: Payer: Medicare HMO | Admitting: Cardiology

## 2022-12-21 NOTE — Progress Notes (Unsigned)
Electrophysiology Office Follow up Visit Note:    Date:  12/22/2022   ID:  Kent James, DOB 04-30-1945, MRN [id number]  PCP:  Lorre Munroe, NP  [facility] Cardiologist:  Christell Constant, MD  [facility] Electrophysiologist:  Lanier Prude, MD    Interval History:     Kent James is a 77 y.o. male who presents for a follow up visit.   He had a prior A-fib ablation December 11, 2018 by Dr. Johney Frame.  During that procedure, the pulmonary veins and SVC were isolated.  The posterior wall was not targeted with ablation.  Antral ablation was performed of the pulmonary veins.  He underwent left atrial appendage occlusion June 15, 2022 with a successful implant of a 24 mm device.  Follow-up CT scan on August 28, 2022 showed a fully endothelialized Watchman with no leak and excellent compression.  He saw [name] in clinic December 12, 2022.  At the appointment with general he reported several months of symptomatic atrial fibrillation.  He reported shortness of breath with exertion.  He was interested in repeat ablation and presents today to discuss this treatment option.  Today he reports several months of shortness of breath that he attributes to his atrial fibrillation.  He has an upcoming trip planned out of the country.      Past medical, surgical, social and family history were reviewed.  ROS:   Please see the history of present illness.    All other systems reviewed and are negative.  EKGs/Labs/Other Studies Reviewed:    The following studies were reviewed today:  December 13, 2022 echo EF 55-60 RV normal Trivial MR Mild AI         Physical Exam:    VS:  BP 132/84 (BP Location: Right Arm, Patient Position: Sitting)   Pulse 64   Ht 5' 10.5" (1.791 m)   Wt 256 lb 12.8 oz (116.5 kg)   SpO2 96%   BMI 36.33 kg/m     Wt Readings from Last 3 Encounters:  12/22/22 256 lb 12.8 oz (116.5 kg)  12/12/22 251 lb 9.6 oz (114.1 kg)  08/12/22 250  lb (113.4 kg)     GEN: no distress CARD: Irregularly irregular, No MRG RESP: No IWOB. CTAB.      ASSESSMENT:    1. Persistent atrial fibrillation (HCC)   2. Presence of Watchman left atrial appendage closure device   3. Essential hypertension    PLAN:    In order of problems listed above:  #Persistent atrial fibrillation #Watchman device in situ Had early good control of his atrial fibrillation following his 2020 catheter ablation by Dr. Johney Frame.  Now with symptomatic recurrence.  He is experiencing shortness of breath with exertion while in atrial fibrillation.  I discussed treatment options including antiarrhythmic drugs and redo catheter ablation.  The patient is interested in pursuing redo catheter ablation which I think is reasonable.  The veins and posterior wall would be targeted during the ablation procedure.  Discussed treatment options today for AF including antiarrhythmic drug therapy and ablation. Discussed risks, recovery and likelihood of success with each treatment strategy. Risk, benefits, and alternatives to EP study and ablation for afib were discussed. These risks include but are not limited to stroke, bleeding, vascular damage, tamponade, perforation, damage to the esophagus, lungs, phrenic nerve and other structures, pulmonary vein stenosis, worsening renal function, coronary vasospasm and death.  Discussed potential need for repeat ablation procedures and antiarrhythmic drugs after an initial ablation.  The patient understands these risk and wishes to proceed.  We will therefore proceed with catheter ablation at the next available time.  Carto, ICE, anesthesia are requested for the procedure.  Will also obtain CT PV protocol prior to the procedure to exclude LAA thrombus and further evaluate atrial anatomy.  In an effort to get him symptomatic relief sooner, we will go ahead and schedule a cardioversion procedure.  He will go ahead and restart his Eliquis now and we will  plan for cardioversion after at least 3 days of uninterrupted Eliquis.  We will perform a TEE at the time of cardioversion given the Watchman was implanted within 1 year.  I discussed the cardioversion procedure in detail with the patient include the risks and he wishes to proceed.  Check CMP, CBC and lipid panel today.    Signed, Steffanie Dunn, MD, [facility], [facility] 12/22/2022 7:57 PM    Electrophysiology [facility]

## 2022-12-21 NOTE — H&P (View-Only) (Signed)
 Electrophysiology Office Follow up Visit Note:    Date:  12/22/2022   ID:  Kent James, DOB 04-30-1945, MRN [id number]  PCP:  Kent Munroe, NP  [facility] Cardiologist:  Christell Constant, MD  [facility] Electrophysiologist:  Kent Prude, MD    Interval History:     Kent James is a 77 y.o. male who presents for a follow up visit.   He had a prior A-fib ablation December 11, 2018 by Dr. Johney Frame.  During that procedure, the pulmonary veins and SVC were isolated.  The posterior wall was not targeted with ablation.  Antral ablation was performed of the pulmonary veins.  He underwent left atrial appendage occlusion June 15, 2022 with a successful implant of a 24 mm device.  Follow-up CT scan on August 28, 2022 showed a fully endothelialized Watchman with no leak and excellent compression.  He saw Kent James in clinic December 12, 2022.  At the appointment with general he reported several months of symptomatic atrial fibrillation.  He reported shortness of breath with exertion.  He was interested in repeat ablation and presents today to discuss this treatment option.  Today he reports several months of shortness of breath that he attributes to his atrial fibrillation.  He has an upcoming trip planned out of the country.      Past medical, surgical, social and family history were reviewed.  ROS:   Please see the history of present illness.    All other systems reviewed and are negative.  EKGs/Labs/Other Studies Reviewed:    The following studies were reviewed today:  December 13, 2022 echo EF 55-60 RV normal Trivial MR Mild AI         Physical Exam:    VS:  BP 132/84 (BP Location: Right Arm, Patient Position: Sitting)   Pulse 64   Ht 5' 10.5" (1.791 m)   Wt 256 lb 12.8 oz (116.5 kg)   SpO2 96%   BMI 36.33 kg/m     Wt Readings from Last 3 Encounters:  12/22/22 256 lb 12.8 oz (116.5 kg)  12/12/22 251 lb 9.6 oz (114.1 kg)  08/12/22 250  lb (113.4 kg)     GEN: no distress CARD: Irregularly irregular, No MRG RESP: No IWOB. CTAB.      ASSESSMENT:    1. Persistent atrial fibrillation (HCC)   2. Presence of Watchman left atrial appendage closure device   3. Essential hypertension    PLAN:    In order of problems listed above:  #Persistent atrial fibrillation #Watchman device in situ Had early good control of his atrial fibrillation following his 2020 catheter ablation by Dr. Johney Frame.  Now with symptomatic recurrence.  He is experiencing shortness of breath with exertion while in atrial fibrillation.  I discussed treatment options including antiarrhythmic drugs and redo catheter ablation.  The patient is interested in pursuing redo catheter ablation which I think is reasonable.  The veins and posterior wall would be targeted during the ablation procedure.  Discussed treatment options today for AF including antiarrhythmic drug therapy and ablation. Discussed risks, recovery and likelihood of success with each treatment strategy. Risk, benefits, and alternatives to EP study and ablation for afib were discussed. These risks include but are not limited to stroke, bleeding, vascular damage, tamponade, perforation, damage to the esophagus, lungs, phrenic nerve and other structures, pulmonary vein stenosis, worsening renal function, coronary vasospasm and death.  Discussed potential need for repeat ablation procedures and antiarrhythmic drugs after an initial ablation.  The patient understands these risk and wishes to proceed.  We will therefore proceed with catheter ablation at the next available time.  Carto, ICE, anesthesia are requested for the procedure.  Will also obtain CT PV protocol prior to the procedure to exclude LAA thrombus and further evaluate atrial anatomy.  In an effort to get him symptomatic relief sooner, we will go ahead and schedule a cardioversion procedure.  He will go ahead and restart his Eliquis now and we will  plan for cardioversion after at least 3 days of uninterrupted Eliquis.  We will perform a TEE at the time of cardioversion given the Watchman was implanted within 1 year.  I discussed the cardioversion procedure in detail with the patient include the risks and he wishes to proceed.  Check CMP, CBC and lipid panel today.    Signed, Steffanie Dunn, MD, [facility], [facility] 12/22/2022 7:57 PM    Electrophysiology [facility]

## 2022-12-22 ENCOUNTER — Ambulatory Visit: Payer: Medicare HMO | Attending: Cardiology | Admitting: Cardiology

## 2022-12-22 ENCOUNTER — Encounter: Payer: Self-pay | Admitting: Cardiology

## 2022-12-22 VITALS — BP 132/84 | HR 64 | Ht 70.5 in | Wt 256.8 lb

## 2022-12-22 DIAGNOSIS — I10 Essential (primary) hypertension: Secondary | ICD-10-CM | POA: Diagnosis not present

## 2022-12-22 DIAGNOSIS — Z95818 Presence of other cardiac implants and grafts: Secondary | ICD-10-CM

## 2022-12-22 DIAGNOSIS — I4819 Other persistent atrial fibrillation: Secondary | ICD-10-CM

## 2022-12-22 MED ORDER — APIXABAN 5 MG PO TABS: 5.0000 mg | ORAL_TABLET | Freq: Two times a day (BID) | ORAL | 3 refills | Status: DC

## 2022-12-22 NOTE — Patient Instructions (Addendum)
Medication Instructions:  Your physician has recommended you make the following change in your medication:  1) RESTART Eliquis 5 mg twice daily  2) STOP taking aspirin  *If you need a refill on your cardiac medications before your next appointment, please call your pharmacy*   Lab Work: TODAY: CBC, CMET, and Lipids  If you have labs (blood work) drawn today and your tests are completely normal, you will receive your results only by: MyChart Message (if you have MyChart) OR A paper copy in the mail If you have any lab test that is abnormal or we need to change your treatment, we will call you to review the results.   Testing/Procedures: Cardioversion Your physician has recommended that you have a Cardioversion (DCCV). Electrical Cardioversion uses a jolt of electricity to your heart either through paddles or wired patches attached to your chest. This is a controlled, usually prescheduled, procedure. Defibrillation is done under light anesthesia in the hospital, and you usually go home the day of the procedure. This is done to get your heart back into a normal rhythm. You are not awake for the procedure. Please see the instruction sheet given to you today.  Cardiac CT Your physician has requested that you have cardiac CT. Cardiac computed tomography (CT) is a painless test that uses an x-ray machine to take clear, detailed pictures of your heart. For further information please visit [email or web address] We will call you to schedule your CT scan. It will be done about three weeks prior to your ablation.  Ablation Your physician has recommended that you have an ablation. Catheter ablation is a medical procedure used to treat some cardiac arrhythmias (irregular heartbeats). During catheter ablation, a long, thin, flexible tube is put into a blood vessel in your groin (upper thigh), or neck. This tube is called an ablation catheter. It is then guided to your heart through the blood vessel. Radio  frequency waves destroy small areas of heart tissue where abnormal heartbeats may cause an arrhythmia to start.  You are scheduled for Atrial Fibrillation Ablation on Tuesday, January 28 with Dr. Steffanie Dunn.Please arrive at the Main Entrance A at [facility]: [address] [place], [place] [address] at 5:30 AM    Follow-Up: At [facility], you and your health needs are our priority.  As part of our continuing mission to provide you with exceptional heart care, we have created designated Provider Care Teams.  These Care Teams include your primary Cardiologist (physician) and Advanced Practice Providers (APPs -  Physician Assistants and Nurse Practitioners) who all work together to provide you with the care you need, when you need it.  Your next appointment:   We will call you to schedule your follow up appointments.

## 2022-12-23 LAB — CBC
Hematocrit: 49.5 % (ref 37.5–51.0)
Hemoglobin: 17 g/dL (ref 13.0–17.7)
MCH: 31.1 pg (ref 26.6–33.0)
MCHC: 34.3 g/dL (ref 31.5–35.7)
MCV: 91 fL (ref 79–97)
Platelets: 199 10*3/uL (ref 150–450)
RBC: 5.46 x10E6/uL (ref 4.14–5.80)
RDW: 13.3 % (ref 11.6–15.4)
WBC: 7 10*3/uL (ref 3.4–10.8)

## 2022-12-23 LAB — LIPID PANEL
Chol/HDL Ratio: 3.3 {ratio} (ref 0.0–5.0)
Cholesterol, Total: 137 mg/dL (ref 100–199)
HDL: 41 mg/dL (ref >39)
LDL Chol Calc (NIH): 81 mg/dL (ref 0–99)
Triglycerides: 76 mg/dL (ref 0–149)
VLDL Cholesterol Cal: 15 mg/dL (ref 5–40)

## 2022-12-23 LAB — COMPREHENSIVE METABOLIC PANEL
ALT: 14 [IU]/L (ref 0–44)
AST: 20 [IU]/L (ref 0–40)
Albumin: 4.3 g/dL (ref 3.8–4.8)
Alkaline Phosphatase: 80 [IU]/L (ref 44–121)
BUN/Creatinine Ratio: 11 (ref 10–24)
BUN: 10 mg/dL (ref 8–27)
Bilirubin Total: 0.8 mg/dL (ref 0.0–1.2)
CO2: 24 mmol/L (ref 20–29)
Calcium: 9.3 mg/dL (ref 8.6–10.2)
Chloride: 103 mmol/L (ref 96–106)
Creatinine, Ser: 0.92 mg/dL (ref 0.76–1.27)
Globulin, Total: 2.3 g/dL (ref 1.5–4.5)
Glucose: 94 mg/dL (ref 70–99)
Potassium: 4.7 mmol/L (ref 3.5–5.2)
Sodium: 143 mmol/L (ref 134–144)
Total Protein: 6.6 g/dL (ref 6.0–8.5)
eGFR: 86 mL/min/{1.73_m2} (ref >59)

## 2022-12-26 ENCOUNTER — Telehealth: Payer: Self-pay

## 2022-12-26 DIAGNOSIS — Z01818 Encounter for other preprocedural examination: Secondary | ICD-10-CM

## 2022-12-26 DIAGNOSIS — I4819 Other persistent atrial fibrillation: Secondary | ICD-10-CM

## 2022-12-26 NOTE — Progress Notes (Signed)
Spoke to patient to arrive at 0830am , medications to take, NPO after MN, and to have a responsible person after the procedure to drive home and stay for 24 hours

## 2022-12-26 NOTE — Telephone Encounter (Signed)
Spoke with patient, will have lab work completed on 01/13/23  prior to CT on 01/16/23 - will send instruction letters thru MyChart per patient request. No needs at this time

## 2022-12-27 ENCOUNTER — Ambulatory Visit (HOSPITAL_COMMUNITY): Payer: Medicare HMO | Admitting: Anesthesiology

## 2022-12-27 ENCOUNTER — Ambulatory Visit (HOSPITAL_BASED_OUTPATIENT_CLINIC_OR_DEPARTMENT_OTHER): Payer: Medicare HMO | Admitting: Anesthesiology

## 2022-12-27 ENCOUNTER — Other Ambulatory Visit: Payer: Self-pay

## 2022-12-27 ENCOUNTER — Ambulatory Visit (HOSPITAL_COMMUNITY)
Admission: RE | Admit: 2022-12-27 | Discharge: 2022-12-27 | Disposition: A | Discharge status: Home / Self Care | Payer: Medicare HMO | Source: Ambulatory Visit | Attending: Cardiology | Admitting: Cardiology

## 2022-12-27 ENCOUNTER — Encounter (HOSPITAL_COMMUNITY)
Admission: RE | Disposition: A | Discharge status: Home / Self Care | Payer: Self-pay | Source: Ambulatory Visit | Attending: Cardiology

## 2022-12-27 ENCOUNTER — Encounter (HOSPITAL_COMMUNITY): Payer: Self-pay | Admitting: Cardiology

## 2022-12-27 DIAGNOSIS — I4891 Unspecified atrial fibrillation: Secondary | ICD-10-CM | POA: Diagnosis not present

## 2022-12-27 DIAGNOSIS — I4819 Other persistent atrial fibrillation: Secondary | ICD-10-CM | POA: Diagnosis present

## 2022-12-27 DIAGNOSIS — Z01818 Encounter for other preprocedural examination: Secondary | ICD-10-CM

## 2022-12-27 DIAGNOSIS — I10 Essential (primary) hypertension: Secondary | ICD-10-CM | POA: Diagnosis not present

## 2022-12-27 DIAGNOSIS — I080 Rheumatic disorders of both mitral and aortic valves: Secondary | ICD-10-CM | POA: Diagnosis not present

## 2022-12-27 DIAGNOSIS — R0609 Other forms of dyspnea: Secondary | ICD-10-CM | POA: Insufficient documentation

## 2022-12-27 DIAGNOSIS — I340 Nonrheumatic mitral (valve) insufficiency: Secondary | ICD-10-CM

## 2022-12-27 DIAGNOSIS — I2510 Atherosclerotic heart disease of native coronary artery without angina pectoris: Secondary | ICD-10-CM | POA: Diagnosis not present

## 2022-12-27 DIAGNOSIS — Z87891 Personal history of nicotine dependence: Secondary | ICD-10-CM | POA: Insufficient documentation

## 2022-12-27 DIAGNOSIS — Z95818 Presence of other cardiac implants and grafts: Secondary | ICD-10-CM | POA: Insufficient documentation

## 2022-12-27 DIAGNOSIS — Z7901 Long term (current) use of anticoagulants: Secondary | ICD-10-CM | POA: Diagnosis not present

## 2022-12-27 HISTORY — PX: CARDIOVERSION: EP1203

## 2022-12-27 HISTORY — PX: TRANSESOPHAGEAL ECHOCARDIOGRAM (CATH LAB): EP1270

## 2022-12-27 LAB — ECHO TEE

## 2022-12-27 SURGERY — TRANSESOPHAGEAL ECHOCARDIOGRAM (TEE) (CATHLAB)
Anesthesia: Monitor Anesthesia Care

## 2022-12-27 MED ORDER — SODIUM CHLORIDE 0.9 % IV SOLN: INTRAVENOUS | Status: DC

## 2022-12-27 MED ORDER — PROPOFOL 10 MG/ML IV BOLUS
INTRAVENOUS | Status: DC | PRN
  Administered 2022-12-27: 40 mg via INTRAVENOUS

## 2022-12-27 MED ORDER — LIDOCAINE 2% (20 MG/ML) 5 ML SYRINGE
INTRAMUSCULAR | Status: DC | PRN
  Administered 2022-12-27: 100 mg via INTRAVENOUS

## 2022-12-27 MED ORDER — PROPOFOL 500 MG/50ML IV EMUL
INTRAVENOUS | Status: DC | PRN
  Administered 2022-12-27: 50 ug/kg/min via INTRAVENOUS

## 2022-12-27 SURGICAL SUPPLY — 1 items: PAD DEFIB RADIO PHYSIO CONN (PAD) ×1 IMPLANT

## 2022-12-27 NOTE — CV Procedure (Signed)
   TRANSESOPHAGEAL ECHOCARDIOGRAM GUIDED DIRECT CURRENT CARDIOVERSION  NAME:  Kent James   MRN: [id number] DOB:  21-Aug-1945   ADMIT DATE: 12/27/2022  INDICATIONS: Symptomatic atrial fibrillation  PROCEDURE:   Informed consent was obtained prior to the procedure. The risks, benefits and alternatives for the procedure were discussed and the patient comprehended these risks.  Risks include, but are not limited to, cough, sore throat, vomiting, nausea, somnolence, esophageal and stomach trauma or perforation, bleeding, low blood pressure, aspiration, pneumonia, infection, trauma to the teeth and death.    After a procedural time-out, the oropharynx was anesthetized and the patient was sedated by the anesthesia service. The transesophageal probe was inserted in the esophagus and stomach without difficulty and multiple views were obtained. Anesthesia was monitored by Allyn Kenner, CRNA.   COMPLICATIONS:    Complications: No complications Patient tolerated procedure well.  FINDINGS:  Watchman device in place with no peridevice leak or device related thrombus seen.   CARDIOVERSION:     Indications:  Symptomatic Atrial Fibrillation  Procedure Details:  Once the TEE was complete, the patient had the defibrillator pads placed in the anterior and posterior position. Once an appropriate level of sedation was confirmed, the patient was cardioverted x 1 with 200J of biphasic synchronized energy.  The patient converted to NSR with rate 70s.  There were no apparent complications.  The patient had normal neuro status and respiratory status post procedure with vitals stable as recorded elsewhere.  Adequate airway was maintained throughout and vital signs monitored per protocol.  Epifanio Lesches MD [facility]  [address] [place], [place] [address] [phone]   10:20 AM

## 2022-12-27 NOTE — Interval H&P Note (Signed)
History and Physical Interval Note:  12/27/2022 9:40 AM  Kent James  has presented today for surgery, with the diagnosis of AFIB.  The various methods of treatment have been discussed with the patient and family. After consideration of risks, benefits and other options for treatment, the patient has consented to  Procedure(s): TRANSESOPHAGEAL ECHOCARDIOGRAM (N/A) CARDIOVERSION (N/A) as a surgical intervention.  The patient's history has been reviewed, patient examined, no change in status, stable for surgery.  I have reviewed the patient's chart and labs.  Questions were answered to the patient's satisfaction.     [name]

## 2022-12-27 NOTE — Transfer of Care (Signed)
Immediate Anesthesia Transfer of Care Note  Patient: Kent James  Procedure(s) Performed: TRANSESOPHAGEAL ECHOCARDIOGRAM CARDIOVERSION  Patient Location: Cath Lab holding  Anesthesia Type:MAC  Level of Consciousness: awake, alert , and oriented  Airway & Oxygen Therapy: Patient Spontanous Breathing and Patient connected to nasal cannula oxygen  Post-op Assessment: Report given to RN and Post -op Vital signs reviewed and stable  Post vital signs: Reviewed and stable  Last Vitals:  Vitals Value Taken Time  BP    Temp    Pulse 78 12/27/22 1015  Resp 13 12/27/22 1015  SpO2 95 % 12/27/22 1015  Vitals shown include unfiled device data.  Last Pain:  Vitals:   12/27/22 0832  TempSrc: Temporal         Complications: No notable events documented.

## 2023-01-02 NOTE — Anesthesia Postprocedure Evaluation (Signed)
Anesthesia Post Note  Patient: Kent James  Procedure(s) Performed: TRANSESOPHAGEAL ECHOCARDIOGRAM CARDIOVERSION     Patient location during evaluation: Cath Lab Anesthesia Type: General Level of consciousness: awake and alert Pain management: pain level controlled Vital Signs Assessment: post-procedure vital signs reviewed and stable Respiratory status: spontaneous breathing, nonlabored ventilation and respiratory function stable Cardiovascular status: blood pressure returned to baseline and stable Postop Assessment: no apparent nausea or vomiting Anesthetic complications: no   There were no known notable events for this encounter.  Last Vitals:  Vitals:   12/27/22 1040 12/27/22 1045  BP: 121/75   Pulse: 66   Resp: (!) 23   Temp:  36.6 C  SpO2: 96%     Last Pain:  Vitals:   12/27/22 1045  TempSrc: Temporal  PainSc:    Pain Goal:                   [name]

## 2023-01-02 NOTE — Anesthesia Preprocedure Evaluation (Signed)
Anesthesia Evaluation  Patient identified by MRN, date of birth, ID band Patient awake    Reviewed: Allergy & Precautions, H&P , NPO status , Patient's Chart, lab work & pertinent test results  Airway Mallampati: II   Neck ROM: full    Dental  (+) Dental Advisory Given   Pulmonary asthma , former smoker   breath sounds clear to auscultation       Cardiovascular hypertension, + CAD  + dysrhythmias Atrial Fibrillation  Rhythm:irregular     Neuro/Psych    GI/Hepatic ,GERD  ,,  Endo/Other    Renal/GU      Musculoskeletal   Abdominal   Peds  Hematology   Anesthesia Other Findings   Reproductive/Obstetrics                              Anesthesia Physical Anesthesia Plan  ASA: 3  Anesthesia Plan: General   Post-op Pain Management:    Induction: Intravenous  PONV Risk Score and Plan: 2 and Treatment may vary due to age or medical condition  Airway Management Planned: Nasal Cannula, Natural Airway and Simple Face Mask  Additional Equipment: None  Intra-op Plan:   Post-operative Plan: Extubation in OR  Informed Consent: I have reviewed the patients History and Physical, chart, labs and discussed the procedure including the risks, benefits and alternatives for the proposed anesthesia with the patient or authorized representative who has indicated his/her understanding and acceptance.     Dental advisory given  Plan Discussed with: CRNA  Anesthesia Plan Comments:         Anesthesia Quick Evaluation

## 2023-01-10 ENCOUNTER — Ambulatory Visit (HOSPITAL_COMMUNITY): Payer: Medicare HMO

## 2023-01-11 ENCOUNTER — Other Ambulatory Visit: Payer: Self-pay | Admitting: Cardiology

## 2023-01-13 ENCOUNTER — Telehealth (HOSPITAL_COMMUNITY): Payer: Self-pay | Admitting: Emergency Medicine

## 2023-01-13 DIAGNOSIS — Z01818 Encounter for other preprocedural examination: Secondary | ICD-10-CM | POA: Diagnosis not present

## 2023-01-13 DIAGNOSIS — I4819 Other persistent atrial fibrillation: Secondary | ICD-10-CM | POA: Diagnosis not present

## 2023-01-13 NOTE — Telephone Encounter (Signed)
 Reaching out to patient to offer assistance regarding upcoming cardiac imaging study; pt verbalizes understanding of appt date/time, parking situation and where to check in, pre-test NPO status and medications ordered, and verified current allergies; name and call back number provided for further questions should they arise Rockwell Alexandria RN Navigator Cardiac Imaging [facility] [phone] office [phone] cell

## 2023-01-14 LAB — CBC
Hematocrit: 47.4 % (ref 37.5–51.0)
Hemoglobin: 16.5 g/dL (ref 13.0–17.7)
MCH: 32.1 pg (ref 26.6–33.0)
MCHC: 34.8 g/dL (ref 31.5–35.7)
MCV: 92 fL (ref 79–97)
Platelets: 204 10*3/uL (ref 150–450)
RBC: 5.14 x10E6/uL (ref 4.14–5.80)
RDW: 12.8 % (ref 11.6–15.4)
WBC: 7.3 10*3/uL (ref 3.4–10.8)

## 2023-01-14 LAB — BASIC METABOLIC PANEL
BUN/Creatinine Ratio: 11 (ref 10–24)
BUN: 11 mg/dL (ref 8–27)
CO2: 22 mmol/L (ref 20–29)
Calcium: 9.4 mg/dL (ref 8.6–10.2)
Chloride: 103 mmol/L (ref 96–106)
Creatinine, Ser: 1.04 mg/dL (ref 0.76–1.27)
Glucose: 102 mg/dL — ABNORMAL HIGH (ref 70–99)
Potassium: 4.4 mmol/L (ref 3.5–5.2)
Sodium: 144 mmol/L (ref 134–144)
eGFR: 74 mL/min/{1.73_m2} (ref >59)

## 2023-01-16 ENCOUNTER — Ambulatory Visit (HOSPITAL_COMMUNITY)
Admission: RE | Admit: 2023-01-16 | Discharge: 2023-01-16 | Disposition: A | Discharge status: Home / Self Care | Payer: Medicare HMO | Source: Ambulatory Visit | Attending: Cardiology | Admitting: Cardiology

## 2023-01-16 DIAGNOSIS — I10 Essential (primary) hypertension: Secondary | ICD-10-CM | POA: Insufficient documentation

## 2023-01-16 DIAGNOSIS — Z95818 Presence of other cardiac implants and grafts: Secondary | ICD-10-CM | POA: Insufficient documentation

## 2023-01-16 DIAGNOSIS — I4819 Other persistent atrial fibrillation: Secondary | ICD-10-CM | POA: Diagnosis not present

## 2023-01-16 MED ORDER — IOHEXOL 350 MG/ML SOLN
75.0000 mL | Freq: Once | INTRAVENOUS | Status: AC | PRN
  Administered 2023-01-16: 75 mL via INTRAVENOUS

## 2023-01-25 ENCOUNTER — Other Ambulatory Visit: Payer: Self-pay | Admitting: Cardiology

## 2023-01-30 NOTE — Pre-Procedure Instructions (Signed)
Attempted to call patient regarding procedure instructions.  Left voicemail on the following items: Arrival time 0800 Nothing to eat or drink after midnight No meds AM of procedure Responsible person to drive you home and stay with you for 24 hrs  Have you missed any doses of anti-coagulant Eliquis- should be taken twice a day, if you have missed any doses please let us know.  Dont' take dose in the morning

## 2023-01-31 ENCOUNTER — Ambulatory Visit (HOSPITAL_COMMUNITY)
Admission: RE | Admit: 2023-01-31 | Discharge: 2023-01-31 | Disposition: A | Discharge status: Home / Self Care | Payer: Medicare HMO | Source: Ambulatory Visit | Attending: Cardiology | Admitting: Cardiology

## 2023-01-31 ENCOUNTER — Ambulatory Visit (HOSPITAL_COMMUNITY)
Admission: RE | Disposition: A | Discharge status: Home / Self Care | Payer: Self-pay | Source: Ambulatory Visit | Attending: Cardiology

## 2023-01-31 ENCOUNTER — Other Ambulatory Visit: Payer: Self-pay

## 2023-01-31 ENCOUNTER — Ambulatory Visit (HOSPITAL_BASED_OUTPATIENT_CLINIC_OR_DEPARTMENT_OTHER): Payer: Medicare HMO | Admitting: Anesthesiology

## 2023-01-31 ENCOUNTER — Ambulatory Visit (HOSPITAL_COMMUNITY): Payer: Self-pay | Admitting: Anesthesiology

## 2023-01-31 ENCOUNTER — Other Ambulatory Visit (HOSPITAL_COMMUNITY): Payer: Self-pay

## 2023-01-31 DIAGNOSIS — I4891 Unspecified atrial fibrillation: Secondary | ICD-10-CM | POA: Diagnosis not present

## 2023-01-31 DIAGNOSIS — I4819 Other persistent atrial fibrillation: Secondary | ICD-10-CM

## 2023-01-31 DIAGNOSIS — I2510 Atherosclerotic heart disease of native coronary artery without angina pectoris: Secondary | ICD-10-CM | POA: Diagnosis not present

## 2023-01-31 DIAGNOSIS — J45909 Unspecified asthma, uncomplicated: Secondary | ICD-10-CM

## 2023-01-31 DIAGNOSIS — R0602 Shortness of breath: Secondary | ICD-10-CM | POA: Diagnosis not present

## 2023-01-31 DIAGNOSIS — Z7901 Long term (current) use of anticoagulants: Secondary | ICD-10-CM | POA: Diagnosis not present

## 2023-01-31 DIAGNOSIS — Z95818 Presence of other cardiac implants and grafts: Secondary | ICD-10-CM | POA: Diagnosis not present

## 2023-01-31 DIAGNOSIS — I10 Essential (primary) hypertension: Secondary | ICD-10-CM | POA: Diagnosis not present

## 2023-01-31 DIAGNOSIS — Z87891 Personal history of nicotine dependence: Secondary | ICD-10-CM | POA: Diagnosis not present

## 2023-01-31 DIAGNOSIS — I480 Paroxysmal atrial fibrillation: Secondary | ICD-10-CM | POA: Diagnosis not present

## 2023-01-31 HISTORY — PX: ATRIAL FIBRILLATION ABLATION: EP1191

## 2023-01-31 LAB — POCT ACTIVATED CLOTTING TIME: Activated Clotting Time: 383 s

## 2023-01-31 SURGERY — ATRIAL FIBRILLATION ABLATION
Anesthesia: General

## 2023-01-31 MED ORDER — PROPOFOL 10 MG/ML IV BOLUS
INTRAVENOUS | Status: DC | PRN
  Administered 2023-01-31: 20 mg via INTRAVENOUS
  Administered 2023-01-31: 100 mg via INTRAVENOUS
  Administered 2023-01-31: 30 mg via INTRAVENOUS

## 2023-01-31 MED ORDER — SUGAMMADEX SODIUM 200 MG/2ML IV SOLN
INTRAVENOUS | Status: DC | PRN
  Administered 2023-01-31 (×2): 200 mg via INTRAVENOUS

## 2023-01-31 MED ORDER — ONDANSETRON HCL 4 MG/2ML IJ SOLN
INTRAMUSCULAR | Status: DC | PRN
  Administered 2023-01-31: 4 mg via INTRAVENOUS

## 2023-01-31 MED ORDER — PHENYLEPHRINE 80 MCG/ML (10ML) SYRINGE FOR IV PUSH (FOR BLOOD PRESSURE SUPPORT)
PREFILLED_SYRINGE | INTRAVENOUS | Status: DC | PRN
  Administered 2023-01-31 (×2): 80 ug via INTRAVENOUS

## 2023-01-31 MED ORDER — ROCURONIUM BROMIDE 10 MG/ML (PF) SYRINGE
PREFILLED_SYRINGE | INTRAVENOUS | Status: DC | PRN
  Administered 2023-01-31: 60 mg via INTRAVENOUS
  Administered 2023-01-31: 40 mg via INTRAVENOUS
  Administered 2023-01-31: 20 mg via INTRAVENOUS

## 2023-01-31 MED ORDER — LIDOCAINE 2% (20 MG/ML) 5 ML SYRINGE
INTRAMUSCULAR | Status: DC | PRN
  Administered 2023-01-31: 80 mg via INTRAVENOUS

## 2023-01-31 MED ORDER — ACETAMINOPHEN 325 MG PO TABS: 650.0000 mg | ORAL_TABLET | ORAL | Status: DC | PRN

## 2023-01-31 MED ORDER — HEPARIN (PORCINE) IN NACL 1000-0.9 UT/500ML-% IV SOLN
INTRAVENOUS | Status: DC | PRN
  Administered 2023-01-31 (×3): 500 mL

## 2023-01-31 MED ORDER — CEFAZOLIN SODIUM-DEXTROSE 2-4 GM/100ML-% IV SOLN
INTRAVENOUS | Status: AC
  Filled 2023-01-31: qty 100

## 2023-01-31 MED ORDER — COLCHICINE 0.6 MG PO TABS
0.6000 mg | ORAL_TABLET | Freq: Two times a day (BID) | ORAL | 0 refills | Status: DC
  Filled 2023-01-31: qty 10, 5d supply, fill #0

## 2023-01-31 MED ORDER — DEXAMETHASONE SODIUM PHOSPHATE 10 MG/ML IJ SOLN
INTRAMUSCULAR | Status: DC | PRN
  Administered 2023-01-31: 10 mg via INTRAVENOUS

## 2023-01-31 MED ORDER — SODIUM CHLORIDE 0.9% FLUSH: 3.0000 mL | Freq: Two times a day (BID) | INTRAVENOUS | Status: DC

## 2023-01-31 MED ORDER — FENTANYL CITRATE (PF) 250 MCG/5ML IJ SOLN
INTRAMUSCULAR | Status: DC | PRN
  Administered 2023-01-31: 25 ug via INTRAVENOUS
  Administered 2023-01-31: 75 ug via INTRAVENOUS

## 2023-01-31 MED ORDER — PANTOPRAZOLE SODIUM 40 MG PO TBEC
40.0000 mg | DELAYED_RELEASE_TABLET | Freq: Every day | ORAL | Status: DC
  Administered 2023-01-31: 40 mg via ORAL
  Filled 2023-01-31: qty 1

## 2023-01-31 MED ORDER — SODIUM CHLORIDE 0.9% FLUSH: 3.0000 mL | INTRAVENOUS | Status: DC | PRN

## 2023-01-31 MED ORDER — ESMOLOL HCL 100 MG/10ML IV SOLN
INTRAVENOUS | Status: DC | PRN
  Administered 2023-01-31: 20 mg via INTRAVENOUS

## 2023-01-31 MED ORDER — SODIUM CHLORIDE 0.9 % IV SOLN: 250.0000 mL | INTRAVENOUS | Status: DC | PRN

## 2023-01-31 MED ORDER — ATROPINE SULFATE 1 MG/10ML IJ SOSY
PREFILLED_SYRINGE | INTRAMUSCULAR | Status: DC | PRN
  Administered 2023-01-31: 1 mg via INTRAVENOUS

## 2023-01-31 MED ORDER — ONDANSETRON HCL 4 MG/2ML IJ SOLN: 4.0000 mg | Freq: Four times a day (QID) | INTRAMUSCULAR | Status: DC | PRN

## 2023-01-31 MED ORDER — EPHEDRINE SULFATE-NACL 50-0.9 MG/10ML-% IV SOSY
PREFILLED_SYRINGE | INTRAVENOUS | Status: DC | PRN
  Administered 2023-01-31: 5 mg via INTRAVENOUS

## 2023-01-31 MED ORDER — PHENYLEPHRINE HCL-NACL 20-0.9 MG/250ML-% IV SOLN
INTRAVENOUS | Status: DC | PRN
  Administered 2023-01-31: 20 ug/min via INTRAVENOUS

## 2023-01-31 MED ORDER — PANTOPRAZOLE SODIUM 40 MG PO TBEC
40.0000 mg | DELAYED_RELEASE_TABLET | Freq: Every day | ORAL | 0 refills | Status: DC
  Filled 2023-01-31: qty 45, 45d supply, fill #0

## 2023-01-31 MED ORDER — COLCHICINE 0.6 MG PO TABS
0.6000 mg | ORAL_TABLET | Freq: Two times a day (BID) | ORAL | Status: DC
  Administered 2023-01-31: 0.6 mg via ORAL
  Filled 2023-01-31: qty 1

## 2023-01-31 MED ORDER — APIXABAN 5 MG PO TABS
5.0000 mg | ORAL_TABLET | Freq: Two times a day (BID) | ORAL | Status: DC
  Administered 2023-01-31: 5 mg via ORAL
  Filled 2023-01-31: qty 1

## 2023-01-31 MED ORDER — HEPARIN SODIUM (PORCINE) 1000 UNIT/ML IJ SOLN
INTRAMUSCULAR | Status: DC | PRN
  Administered 2023-01-31: 17000 [IU] via INTRAVENOUS

## 2023-01-31 MED ORDER — PROTAMINE SULFATE 10 MG/ML IV SOLN
INTRAVENOUS | Status: DC | PRN
  Administered 2023-01-31: 10 mg via INTRAVENOUS
  Administered 2023-01-31: 25 mg via INTRAVENOUS

## 2023-01-31 MED ORDER — FENTANYL CITRATE (PF) 100 MCG/2ML IJ SOLN
INTRAMUSCULAR | Status: AC
  Filled 2023-01-31: qty 2

## 2023-01-31 MED ORDER — CEFAZOLIN SODIUM-DEXTROSE 2-3 GM-%(50ML) IV SOLR
INTRAVENOUS | Status: DC | PRN
  Administered 2023-01-31: 2 g via INTRAVENOUS

## 2023-01-31 MED ORDER — ATROPINE SULFATE 1 MG/10ML IJ SOSY
PREFILLED_SYRINGE | INTRAMUSCULAR | Status: AC
  Filled 2023-01-31: qty 10

## 2023-01-31 MED ORDER — SODIUM CHLORIDE 0.9 % IV SOLN: INTRAVENOUS | Status: DC

## 2023-01-31 SURGICAL SUPPLY — 21 items
BAG SNAP BAND KOVER 36X36 (MISCELLANEOUS) IMPLANT
BLANKET WARM UNDERBOD FULL ACC (MISCELLANEOUS) ×1 IMPLANT
CABLE PFA RX CATH CONN (CABLE) IMPLANT
CATH FARAWAVE ABLATION 31 (CATHETERS) IMPLANT
CATH OCTARAY 2.0 F 3-3-3-3-3 (CATHETERS) IMPLANT
CATH SOUNDSTAR ECO 8FR (CATHETERS) IMPLANT
CATH WEBSTER BI DIR CS D-F CRV (CATHETERS) IMPLANT
CLOSURE PERCLOSE PROSTYLE (VASCULAR PRODUCTS) IMPLANT
COVER SWIFTLINK CONNECTOR (BAG) ×1 IMPLANT
DILATOR VESSEL 38 20CM 16FR (INTRODUCER) IMPLANT
GUIDEWIRE INQWIRE 1.5J.035X260 (WIRE) IMPLANT
INQWIRE 1.5J .035X260CM (WIRE) ×1
KIT VERSACROSS CNCT FARADRIVE (KITS) IMPLANT
MAT PREVALON FULL STRYKER (MISCELLANEOUS) IMPLANT
PACK EP LF (CUSTOM PROCEDURE TRAY) ×1 IMPLANT
PAD DEFIB RADIO PHYSIO CONN (PAD) ×1 IMPLANT
PATCH CARTO3 (PAD) IMPLANT
SHEATH FARADRIVE STEERABLE (SHEATH) IMPLANT
SHEATH PINNACLE 8F 10CM (SHEATH) IMPLANT
SHEATH PINNACLE 9F 10CM (SHEATH) IMPLANT
SHEATH PROBE COVER 6X72 (BAG) IMPLANT

## 2023-01-31 NOTE — H&P (Signed)
Electrophysiology Office Follow up Visit Note:     Date:  01/31/2023    ID:  Kent James, DOB 1945/02/16, MRN [id number]   PCP:  Lorre Munroe, NP     [facility] Cardiologist:  Christell Constant, MD  [facility] Electrophysiologist:  Lanier Prude, MD      Interval History:       Kent James is a 78 y.o. male who presents for a follow up visit.    He had a prior A-fib ablation December 11, 2018 by Dr. Johney Frame.  During that procedure, the pulmonary veins and SVC were isolated.  The posterior wall was not targeted with ablation.  Antral ablation was performed of the pulmonary veins.   He underwent left atrial appendage occlusion June 15, 2022 with a successful implant of a 24 mm device.   Follow-up CT scan on August 28, 2022 showed a fully endothelialized Watchman with no leak and excellent compression.   He saw [name] in clinic December 12, 2022.  At the appointment with general he reported several months of symptomatic atrial fibrillation.  He reported shortness of breath with exertion.  He was interested in repeat ablation and presents today to discuss this treatment option.   Today he reports several months of shortness of breath that he attributes to his atrial fibrillation.  He has an upcoming trip planned out of the country. Objective Past medical, surgical, social and family history were reviewed.    Today he presents for AF ablation. Procedure reviewed.  ROS:   Please see the history of present illness.    All other systems reviewed and are negative.   EKGs/Labs/Other Studies Reviewed:     The following studies were reviewed today:   December 13, 2022 echo EF 55-60 RV normal Trivial MR Mild AI             Physical Exam:     VS:  BP 144/89 (BP Location: Right Arm, Patient Position: Sitting)   Pulse 80   Ht 5' 10.5" (1.791 m)   Wt 256 lb 12.8 oz (116.5 kg)   SpO2 96%   BMI 36.33 kg/m         Wt Readings from Last 3  Encounters:  12/22/22 256 lb 12.8 oz (116.5 kg)  12/12/22 251 lb 9.6 oz (114.1 kg)  08/12/22 250 lb (113.4 kg)      GEN: no distress CARD: Irregularly irregular, No MRG RESP: No IWOB. CTAB.     Assessment ASSESSMENT:     1. Persistent atrial fibrillation (HCC)   2. Presence of Watchman left atrial appendage closure device   3. Essential hypertension     PLAN:     In order of problems listed above:   #Persistent atrial fibrillation #Watchman device in situ Had early good control of his atrial fibrillation following his 2020 catheter ablation by Dr. Johney Frame.  Now with symptomatic recurrence.  He is experiencing shortness of breath with exertion while in atrial fibrillation.  I discussed treatment options including antiarrhythmic drugs and redo catheter ablation.  The patient is interested in pursuing redo catheter ablation which I think is reasonable.  The veins and posterior wall would be targeted during the ablation procedure.   Discussed treatment options today for AF including antiarrhythmic drug therapy and ablation. Discussed risks, recovery and likelihood of success with each treatment strategy. Risk, benefits, and alternatives to EP study and ablation for afib were discussed. These risks include but are not limited to stroke,  bleeding, vascular damage, tamponade, perforation, damage to the esophagus, lungs, phrenic nerve and other structures, pulmonary vein stenosis, worsening renal function, coronary vasospasm and death.  Discussed potential need for repeat ablation procedures and antiarrhythmic drugs after an initial ablation. The patient understands these risk and wishes to proceed.  We will therefore proceed with catheter ablation at the next available time.  Carto, ICE, anesthesia are requested for the procedure.  Will also obtain CT PV protocol prior to the procedure to exclude LAA thrombus and further evaluate atrial anatomy.   In an effort to get him symptomatic relief  sooner, we will go ahead and schedule a cardioversion procedure.  He will go ahead and restart his Eliquis now and we will plan for cardioversion after at least 3 days of uninterrupted Eliquis.  We will perform a TEE at the time of cardioversion given the Watchman was implanted within 1 year.  I discussed the cardioversion procedure in detail with the patient include the risks and he wishes to proceed.   Check CMP, CBC and lipid panel today.    Presents for AF ablation today. Procedure reviewed.   Signed, Steffanie Dunn, MD, [facility], [facility] 01/31/2023 Electrophysiology [facility]

## 2023-01-31 NOTE — Anesthesia Postprocedure Evaluation (Signed)
Anesthesia Post Note  Patient: Kent James  Procedure(s) Performed: ATRIAL FIBRILLATION ABLATION     Patient location during evaluation: PACU Anesthesia Type: General Level of consciousness: awake and alert Pain management: pain level controlled Vital Signs Assessment: post-procedure vital signs reviewed and stable Respiratory status: spontaneous breathing, nonlabored ventilation, respiratory function stable and patient connected to nasal cannula oxygen Cardiovascular status: blood pressure returned to baseline and stable Postop Assessment: no apparent nausea or vomiting Anesthetic complications: no   There were no known notable events for this encounter.  Last Vitals:  Vitals:   01/31/23 1300 01/31/23 1315  BP: 130/88 135/88  Pulse: 90 89  Resp: 19 16  Temp:    SpO2: 91% 93%    Last Pain:  Vitals:   01/31/23 1245  TempSrc:   PainSc: 0-No pain                 Rayn Shorb

## 2023-01-31 NOTE — Anesthesia Preprocedure Evaluation (Addendum)
Anesthesia Evaluation  Patient identified by MRN, date of birth, ID band Patient awake    Reviewed: Allergy & Precautions, H&P , NPO status , Patient's Chart, lab work & pertinent test results  Airway Mallampati: III  TM Distance: <3 FB Neck ROM: full  Mouth opening: Limited Mouth Opening  Dental  (+) Dental Advisory Given, Poor Dentition, Missing,    Pulmonary asthma , former smoker   breath sounds clear to auscultation       Cardiovascular hypertension, Pt. on medications + CAD  + dysrhythmias Atrial Fibrillation  Rhythm:irregular  ECHO 12/24 1. Left ventricular ejection fraction, by estimation, is 55 to 60%. The  left ventricle has normal function. The left ventricle has no regional  wall motion abnormalities.   2. Right ventricular systolic function is normal. The right ventricular  size is normal.   3. S/p Watchman left atrial appendage occlusion device. No peridevice  leak or device related thrombus seen.   4. The mitral valve is normal in structure. Mild mitral valve  regurgitation.   5. The aortic valve is tricuspid. Aortic valve regurgitation is mild. No  aortic stenosis is present.     Neuro/Psych    GI/Hepatic ,GERD  ,,  Endo/Other    Class 3 obesity  Renal/GU      Musculoskeletal   Abdominal   Peds  Hematology   Anesthesia Other Findings   Reproductive/Obstetrics                             Anesthesia Physical Anesthesia Plan  ASA: 3  Anesthesia Plan: General   Post-op Pain Management: Minimal or no pain anticipated   Induction: Intravenous  PONV Risk Score and Plan: 2 and Treatment may vary due to age or medical condition  Airway Management Planned: Oral ETT  Additional Equipment: None  Intra-op Plan:   Post-operative Plan: Extubation in OR  Informed Consent: I have reviewed the patients History and Physical, chart, labs and discussed the procedure including  the risks, benefits and alternatives for the proposed anesthesia with the patient or authorized representative who has indicated his/her understanding and acceptance.     Dental advisory given  Plan Discussed with: CRNA and Anesthesiologist  Anesthesia Plan Comments: (  )       Anesthesia Quick Evaluation

## 2023-01-31 NOTE — Discharge Instructions (Signed)

## 2023-01-31 NOTE — Transfer of Care (Signed)
Immediate Anesthesia Transfer of Care Note  Patient: Kent James  Procedure(s) Performed: ATRIAL FIBRILLATION ABLATION  Patient Location: PACU  Anesthesia Type:General  Level of Consciousness: awake, alert , and oriented  Airway & Oxygen Therapy: Patient Spontanous Breathing and Patient connected to nasal cannula oxygen  Post-op Assessment: Report given to RN, Post -op Vital signs reviewed and stable, and Patient moving all extremities  Post vital signs: Reviewed and stable  Last Vitals:  Vitals Value Taken Time  BP 128/87   Temp 97.5   Pulse 94   Resp 15   SpO2 93%     Last Pain:  Vitals:   01/31/23 0814  TempSrc: Oral         Complications: There were no known notable events for this encounter.

## 2023-01-31 NOTE — Anesthesia Procedure Notes (Signed)
Procedure Name: Intubation Date/Time: 01/31/2023 10:11 AM  Performed by: Kayleen Memos, CRNAPre-anesthesia Checklist: Patient identified, Emergency Drugs available, Suction available and Patient being monitored Patient Re-evaluated:Patient Re-evaluated prior to induction Oxygen Delivery Method: Circle System Utilized Preoxygenation: Pre-oxygenation with 100% oxygen Induction Type: IV induction Ventilation: Mask ventilation with difficulty, Two handed mask ventilation required and Oral airway inserted - appropriate to patient size Laryngoscope Size: Mac and 4 Grade View: Grade I Tube type: Oral Number of attempts: 1 Airway Equipment and Method: Stylet and Oral airway Placement Confirmation: ETT inserted through vocal cords under direct vision, positive ETCO2 and breath sounds checked- equal and bilateral Secured at: 23 cm Tube secured with: Tape Dental Injury: Teeth and Oropharynx as per pre-operative assessment

## 2023-02-01 ENCOUNTER — Encounter: Payer: Self-pay | Admitting: Cardiology

## 2023-02-01 ENCOUNTER — Encounter (HOSPITAL_COMMUNITY): Payer: Self-pay | Admitting: Cardiology

## 2023-02-01 MED FILL — Cefazolin Sodium-Dextrose IV Solution 2 GM/100ML-4%: INTRAVENOUS | Qty: 100 | Status: AC

## 2023-02-01 MED FILL — Fentanyl Citrate Preservative Free (PF) Inj 100 MCG/2ML: INTRAMUSCULAR | Qty: 2 | Status: AC

## 2023-02-21 ENCOUNTER — Encounter (HOSPITAL_COMMUNITY): Payer: Self-pay

## 2023-02-21 ENCOUNTER — Ambulatory Visit (HOSPITAL_COMMUNITY): Admit: 2023-02-21 | Payer: Medicare HMO | Admitting: Surgery

## 2023-02-21 SURGERY — ABDOMINAL AORTOGRAM W/LOWER EXTREMITY
Anesthesia: LOCAL

## 2023-02-22 DIAGNOSIS — H52223 Regular astigmatism, bilateral: Secondary | ICD-10-CM | POA: Diagnosis not present

## 2023-02-22 DIAGNOSIS — H524 Presbyopia: Secondary | ICD-10-CM | POA: Diagnosis not present

## 2023-02-28 ENCOUNTER — Ambulatory Visit (HOSPITAL_COMMUNITY)
Admission: RE | Admit: 2023-02-28 | Discharge: 2023-02-28 | Disposition: A | Discharge status: Home / Self Care | Payer: Medicare HMO | Source: Ambulatory Visit | Attending: Physician Assistant | Admitting: Physician Assistant

## 2023-02-28 ENCOUNTER — Encounter (HOSPITAL_COMMUNITY): Payer: Self-pay | Admitting: Physician Assistant

## 2023-02-28 VITALS — BP 136/90 | HR 78 | Ht 71.0 in | Wt 254.8 lb

## 2023-02-28 DIAGNOSIS — D6869 Other thrombophilia: Secondary | ICD-10-CM | POA: Diagnosis not present

## 2023-02-28 DIAGNOSIS — I4819 Other persistent atrial fibrillation: Secondary | ICD-10-CM | POA: Diagnosis not present

## 2023-02-28 DIAGNOSIS — I2510 Atherosclerotic heart disease of native coronary artery without angina pectoris: Secondary | ICD-10-CM | POA: Insufficient documentation

## 2023-02-28 DIAGNOSIS — E669 Obesity, unspecified: Secondary | ICD-10-CM | POA: Insufficient documentation

## 2023-02-28 DIAGNOSIS — Z7901 Long term (current) use of anticoagulants: Secondary | ICD-10-CM | POA: Diagnosis not present

## 2023-02-28 DIAGNOSIS — I10 Essential (primary) hypertension: Secondary | ICD-10-CM | POA: Diagnosis not present

## 2023-02-28 DIAGNOSIS — Z6835 Body mass index (BMI) 35.0-35.9, adult: Secondary | ICD-10-CM | POA: Insufficient documentation

## 2023-02-28 NOTE — Progress Notes (Signed)
 Primary Care Physician: Lorre Munroe, NP Primary Cardiologist: Dr Izora Ribas  Primary Electrophysiologist: Dr Johney Frame Referring Physician: Dr Felisa Bonier Correa is a 78 y.o. male with a history of paroxysmal atrial fibrillation, CAD, HTN who presents for follow up in the [facility].  The patient was initially diagnosed with atrial fibrillation in 2018 after presenting with symptoms of shortness of breath and an irregular pulse. He underwent two DCCV in 2018 and was started on sotalol. He had done well until 10/23/18 when he again had afib symptoms with heart rates in the 170s. He presented to the ER but converted to SR before being seen. He was seen by Dr Bjorn Pippin and started on anticoagulation. He is on Eliquis for a CHADS2VASC score of 2. Patient does admit to snoring and witnessed apnea and a sleep study has been ordered. Patient reports that 11/05/18 he woke up in the middle of the night and felt he was back out of rhythm. He does have symptoms of fatigue with exertion and palpitations. Patient is s/p afib ablation on 12/11/18 with Dr Johney Frame. He did well post ablation and his sotalol was discontinued.   On follow up 07/12/22, patient is currently in NSR. He has been in Afib since 07/05/22 per phone documentation. Patient noted he spontaneously converted to NSR on the morning of 7/6. No Afib since then. He feels an uneasiness when in Afib, like something feels off. S/p Watchman procedure on 06/15/22. No missed doses of Eliquis.   Follow up 02/28/23. Patient is s/p repeat afib ablation with Dr Lalla Brothers on 01/31/23. He states that he has done well since the procedure with no interim symptoms of afib. He denies chest pain or groin issues.   Today, he denies symptoms of palpitations, chest pain, shortness of breath, orthopnea, PND, lower extremity edema, dizziness, presyncope, syncope, snoring, daytime somnolence, bleeding, or neurologic sequela. The patient is  tolerating medications without difficulties and is otherwise without complaint today.     Atrial Fibrillation Risk Factors:  he does not have symptoms or diagnosis of sleep apnea. Neg sleep study. he does not have a history of rheumatic fever. he does not have a history of alcohol use. The patient does not have a history of early familial atrial fibrillation or other arrhythmias.   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 Lalla Brothers   Snoring     Current Outpatient Medications  Medication Sig Dispense Refill   apixaban (ELIQUIS) 5 MG TABS tablet Take 1 tablet (5 mg total) by mouth 2 (two) times daily. 180 tablet 3   lisinopril (ZESTRIL) 40 MG tablet TAKE 1 TABLET BY MOUTH EVERY DAY 90 tablet 3   pantoprazole (PROTONIX) 20 MG tablet Take 1 tablet (20 mg total) by mouth daily. 90 tablet 3   pantoprazole (PROTONIX) 40 MG tablet Take 1 tablet (40 mg total) by mouth daily. No refills necessary. Post procedure medication. Resume your prior home protonix dosing after the 45 days.  45 tablet 0   rosuvastatin (CRESTOR) 5 MG tablet Take 1 tablet (5 mg total) by mouth daily. 90 tablet 3   No current facility-administered medications for this encounter.    ROS- All systems are reviewed and negative except as per the HPI above.  Physical Exam: Vitals:   02/28/23 1123  BP: (!) 136/90  Pulse: 78  Weight: 115.6 kg  Height: 5\' 11"  (1.803 m)    GEN: Well  nourished, well developed in no acute distress CARDIAC: Regular rate and rhythm, no murmurs, rubs, gallops RESPIRATORY:  Clear to auscultation without rales, wheezing or rhonchi  ABDOMEN: Soft, non-tender, non-distended EXTREMITIES:  No edema; No deformity    Wt Readings from Last 3 Encounters:  02/28/23 115.6 kg  01/31/23 115.7 kg  12/22/22 116.5 kg    EKG today demonstrates  SR Vent. rate 78 BPM PR interval 198 ms QRS duration 88 ms QT/QTcB 378/430 ms   Echo 03/17/22: 1. Left ventricular ejection fraction, by estimation, is 60 to 65%. The  left ventricle has normal function. The left ventricle has no regional  wall motion abnormalities. Left ventricular diastolic parameters were  normal.   2. Right ventricular systolic function is normal. The right ventricular  size is normal.   3. The mitral valve is normal in structure. Trivial mitral valve  regurgitation. No evidence of mitral stenosis.   4. The aortic valve is tricuspid. Aortic valve regurgitation is mild. No  aortic stenosis is present. Aortic regurgitation PHT measures 639 msec.   5. Aortic dilatation noted. There is mild dilatation of the ascending  aorta, measuring 40 mm.   6. The inferior vena cava is normal in size with greater than 50%  respiratory variability, suggesting right atrial pressure of 3 mmHg.   Epic records are reviewed at length today   CHA2DS2-VASc Score = 4  The patient's score is based upon: CHF History: 0 HTN History: 1 Diabetes History: 0 Stroke History: 0 Vascular Disease History: 1 Age Score: 2 Gender Score: 0       ASSESSMENT AND PLAN: Persistent Atrial Fibrillation (ICD10:  I48.19) The patient's CHA2DS2-VASc score is 4, indicating a 4.8% annual risk of stroke.   S/p afib ablation 12/11/18, repeat ablation 01/31/23 S/p Watchman device 06/15/22 Patient appears to be maintaining SR. Continue Eliquis 5 mg BID for now. Will likely be able to discontinue at 3 months post ablation with  Watchman in place.   Secondary Hypercoagulable State (ICD10:  D68.69) The patient is at significant risk for stroke/thromboembolism based upon his CHA2DS2-VASc Score of 4.  Continue Apixaban (Eliquis).   Obesity Body mass index is 35.54 kg/m.  Encouraged lifestyle modification  HTN Stable on current regimen  CAD CAC score 406 No anginal symptoms Followed by Dr Izora Ribas    Follow up with Francis Dowse as scheduled.    Jorja Loa PA-C Af[facility] [address] [place], [place] [address] [phone] 02/28/2023 11:46 AM

## 2023-04-30 ENCOUNTER — Other Ambulatory Visit: Payer: Self-pay | Admitting: Internal Medicine

## 2023-05-01 NOTE — Progress Notes (Unsigned)
 Cardiology Office Note:  .   Date:  05/01/2023  ID:  Kent James, DOB 04-May-1945, MRN [id number] PCP: Carollynn Cirri, NP  [facility] Providers Cardiologist:  Jann Melody, MD Electrophysiologist:  Boyce Byes, MD {  History of Present Illness: .   Kent James is a 78 y.o. male w/PMHx of  GERD, obesity HTN, HLD AFib  Saw the AFib clinic as usual post procedure, 02/28/23, doing well, no symptoms of AFib, no procedural concerns   Today's visit is scheduled as his post ablation visit ROS:   *** eliquis , dose, bleeding, stop *** symptoms *** sites *** ASA 81   Arrhythmia/AAD hx Afib found 2018 AFib ablation 12/11/2018 LAAO 06/15/22 (patient preference to be off OAC) AFib ablation 01/31/2023  Studies Reviewed: Aaron Aas    EKG done today and reviewed by myself:  ***  01/31/23: EPS/ablation CONCLUSIONS: 1. Successful PVI 2. Successful ablation/isolation of the posterior wall 3. Intracardiac echo reveals trivial pericardial effusion, appropriately positioned Watchman device 4. No early apparent complications. 5. Colchicine  0.6mg  PO BID x 5 days 6. Protonix  40mg  PO daily x 45 days    08/19/22: CT IMPRESSION: 1. Well placed 24 mm Watchman FLX device Implant 06/15/22. Fully endothelialized with no leak and average compression 15%   2.  No residual ASD/PFO post trans septal puncture   3.  No pericardial effusion   4.  Mildly dilated ascending thoracic aorta 3.8 cm   5. 3 vessel coronary calcium  with score 406 which is 58 th percentile for age/sex   6.  Normal PV anatomy including right middle PV   7.  Mild bi atrial enlargement  06/15/22: LAAO CONCLUSIONS:  1.Successful implantation of a WATCHMAN left atrial appendage occlusive device    2. TEE demonstrating no LAA thrombus 3. No early apparent complications.   03/17/22: TTE 1. Left ventricular ejection fraction, by estimation, is 60 to 65%. The left ventricle has normal function. The left  ventricle has no regional wall motion abnormalities. Left ventricular diastolic parameters were normal. 2. Right ventricular systolic function is normal. The right ventricular size is normal. 3. The mitral valve is normal in structure. Trivial mitral valve regurgitation. No evidence of mitral stenosis. 4. The aortic valve is tricuspid. Aortic valve regurgitation is mild. No aortic stenosis is present. Aortic regurgitation PHT measures 639 msec. 5. Aortic dilatation noted. There is mild dilatation of the ascending aorta, measuring 40 mm. 6. The inferior vena cava is normal in size with greater than 50% respiratory variability, suggesting right atrial pressure of 3 mmHg.  12/11/2018 EPS/ablation CONCLUSIONS: 1. Sinus rhythm upon presentation.   2. Intracardiac echo reveals a moderate sized left atrium with four separate pulmonary veins without evidence of pulmonary vein stenosis. 3. Successful electrical isolation and anatomical encircling of all four pulmonary veins with radiofrequency current.  A WACA approach was used 3. Additional ablation performed at the SVC-RA junction 4. No inducible arrhythmias following ablation both on and off of isuprel  5. No early apparent complications.   Risk Assessment/Calculations:    Physical Exam:   VS:  There were no vitals taken for this visit.   Wt Readings from Last 3 Encounters:  02/28/23 254 lb 12.8 oz (115.6 kg)  01/31/23 255 lb (115.7 kg)  12/22/22 256 lb 12.8 oz (116.5 kg)    GEN: Well nourished, well developed in no acute distress NECK: No JVD; No carotid bruits CARDIAC: ***RRR, no murmurs, rubs, gallops RESPIRATORY:  *** CTA b/l without rales, wheezing or rhonchi  ABDOMEN: Soft, non-tender, non-distended EXTREMITIES: ***  No edema; No deformity   ASSESSMENT AND PLAN: .    persistent AFib CHA2DS2Vasc is 3, on [place], *** appropriately dosed LAAO >> CT with no leak *** stop Eliquis   HTN ***  Secondary hypercoagulable state 2/2  AFib     {Are you ordering a CV Procedure (e.g. stress test, cath, DCCV, TEE, etc)?   Press F2        :[id number]}     Dispo: ***  Signed, Debbie Fails, PA-C

## 2023-05-02 ENCOUNTER — Ambulatory Visit: Payer: Medicare HMO | Attending: Physician Assistant | Admitting: Physician Assistant

## 2023-05-02 ENCOUNTER — Encounter: Payer: Self-pay | Admitting: Physician Assistant

## 2023-05-02 VITALS — BP 134/84 | HR 73 | Ht 71.0 in | Wt 257.5 lb

## 2023-05-02 DIAGNOSIS — E785 Hyperlipidemia, unspecified: Secondary | ICD-10-CM

## 2023-05-02 DIAGNOSIS — I480 Paroxysmal atrial fibrillation: Secondary | ICD-10-CM | POA: Diagnosis not present

## 2023-05-02 DIAGNOSIS — D6869 Other thrombophilia: Secondary | ICD-10-CM | POA: Diagnosis not present

## 2023-05-02 DIAGNOSIS — I10 Essential (primary) hypertension: Secondary | ICD-10-CM

## 2023-05-02 MED ORDER — ROSUVASTATIN CALCIUM 5 MG PO TABS: 5.0000 mg | ORAL_TABLET | Freq: Every day | ORAL | 3 refills | Status: AC

## 2023-05-02 NOTE — Patient Instructions (Signed)
 Medication Instructions:    START TAKING:  ELIQUIS    STOP TAKING AND REMOVE THIS MEDICATION FROM YOUR MEDICATION LIST:  ASPRIN   *If you need a refill on your cardiac medications before your next appointment, please call your pharmacy*  Lab Work: NONE ORDERED  TODAY   If you have labs (blood work) drawn today and your tests are completely normal, you will receive your results only by: MyChart Message (if you have MyChart) OR A paper copy in the mail If you have any lab test that is abnormal or we need to change your treatment, we will call you to review the results.  Testing/Procedures: NONE ORDERED  TODAY    Follow-Up: At [facility], you and your health needs are our priority.  As part of our continuing mission to provide you with exceptional heart care, our providers are all part of one team.  This team includes your primary Cardiologist (physician) and Advanced Practice Providers or APPs (Physician Assistants and Nurse Practitioners) who all work together to provide you with the care you need, when you need it.  Your next appointment:    6 month(s)  Provider:    You may see Boyce Byes, MD or one of the following Advanced Practice Providers on your designated Care Team:   Mertha Abrahams, [place]    We recommend signing up for the patient portal called "MyChart".  Sign up information is provided on this After Visit Summary.  MyChart is used to connect with patients for Virtual Visits (Telemedicine).  Patients are able to view lab/test results, encounter notes, upcoming appointments, etc.  Non-urgent messages can be sent to your provider as well.   To learn more about what you can do with MyChart, go to [email or web address].   Other Instructions

## 2023-06-05 ENCOUNTER — Other Ambulatory Visit (HOSPITAL_COMMUNITY): Payer: Self-pay

## 2023-06-27 ENCOUNTER — Encounter: Payer: Self-pay | Admitting: Cardiology

## 2023-06-27 ENCOUNTER — Encounter: Payer: Self-pay | Admitting: Internal Medicine

## 2023-08-24 ENCOUNTER — Ambulatory Visit: Payer: Medicare HMO

## 2023-10-17 ENCOUNTER — Other Ambulatory Visit: Payer: Self-pay | Admitting: Cardiology
